# Patient Record
Sex: Female | Born: 1970 | Hispanic: Yes | Marital: Married | Smoking: Never smoker
Health system: Southern US, Community
[De-identification: ages and names within clinical notes are randomized; demographics above are authoritative.]

## PROBLEM LIST (undated history)

## (undated) DIAGNOSIS — K8020 Calculus of gallbladder without cholecystitis without obstruction: Secondary | ICD-10-CM

## (undated) DIAGNOSIS — J45909 Unspecified asthma, uncomplicated: Secondary | ICD-10-CM

## (undated) DIAGNOSIS — D219 Benign neoplasm of connective and other soft tissue, unspecified: Secondary | ICD-10-CM

## (undated) DIAGNOSIS — N920 Excessive and frequent menstruation with regular cycle: Secondary | ICD-10-CM

## (undated) DIAGNOSIS — D649 Anemia, unspecified: Secondary | ICD-10-CM

## (undated) DIAGNOSIS — T7840XA Allergy, unspecified, initial encounter: Secondary | ICD-10-CM

## (undated) DIAGNOSIS — N939 Abnormal uterine and vaginal bleeding, unspecified: Secondary | ICD-10-CM

## (undated) HISTORY — DX: Abnormal uterine and vaginal bleeding, unspecified: N93.9

## (undated) HISTORY — DX: Allergy, unspecified, initial encounter: T78.40XA

## (undated) HISTORY — DX: Excessive and frequent menstruation with regular cycle: N92.0

## (undated) HISTORY — DX: Anemia, unspecified: D64.9

## (undated) HISTORY — DX: Benign neoplasm of connective and other soft tissue, unspecified: D21.9

## (undated) HISTORY — DX: Calculus of gallbladder without cholecystitis without obstruction: K80.20

---

## 2007-07-10 ENCOUNTER — Encounter: Payer: Self-pay | Admitting: Family Medicine

## 2007-08-14 ENCOUNTER — Ambulatory Visit: Payer: Self-pay | Admitting: Family Medicine

## 2007-08-14 DIAGNOSIS — J309 Allergic rhinitis, unspecified: Secondary | ICD-10-CM | POA: Insufficient documentation

## 2007-08-14 DIAGNOSIS — J45909 Unspecified asthma, uncomplicated: Secondary | ICD-10-CM

## 2007-08-14 HISTORY — DX: Unspecified asthma, uncomplicated: J45.909

## 2007-11-01 ENCOUNTER — Encounter: Payer: Self-pay | Admitting: Family Medicine

## 2008-04-15 ENCOUNTER — Encounter: Payer: Self-pay | Admitting: Family Medicine

## 2011-06-07 ENCOUNTER — Ambulatory Visit (HOSPITAL_COMMUNITY): Admission: RE | Admit: 2011-06-07 | Payer: Self-pay | Source: Ambulatory Visit | Admitting: Obstetrics and Gynecology

## 2011-08-27 HISTORY — PX: DILATION AND CURETTAGE OF UTERUS: SHX78

## 2014-09-19 ENCOUNTER — Other Ambulatory Visit: Payer: Self-pay

## 2014-09-19 DIAGNOSIS — Z1231 Encounter for screening mammogram for malignant neoplasm of breast: Secondary | ICD-10-CM

## 2014-09-26 ENCOUNTER — Ambulatory Visit
Admission: RE | Admit: 2014-09-26 | Discharge: 2014-09-26 | Disposition: A | Discharge status: Home / Self Care | Payer: BC Managed Care – PPO | Source: Ambulatory Visit

## 2014-09-26 DIAGNOSIS — Z1231 Encounter for screening mammogram for malignant neoplasm of breast: Secondary | ICD-10-CM

## 2015-11-02 ENCOUNTER — Other Ambulatory Visit: Payer: Self-pay

## 2015-11-02 DIAGNOSIS — Z1231 Encounter for screening mammogram for malignant neoplasm of breast: Secondary | ICD-10-CM

## 2015-11-04 ENCOUNTER — Ambulatory Visit
Admission: RE | Admit: 2015-11-04 | Discharge: 2015-11-04 | Disposition: A | Discharge status: Home / Self Care | Payer: No Typology Code available for payment source | Source: Ambulatory Visit

## 2015-11-04 DIAGNOSIS — Z1231 Encounter for screening mammogram for malignant neoplasm of breast: Secondary | ICD-10-CM

## 2016-04-15 ENCOUNTER — Emergency Department
Admission: EM | Admit: 2016-04-15 | Discharge: 2016-04-15 | Disposition: A | Discharge status: Home / Self Care | Payer: PRIVATE HEALTH INSURANCE | Attending: Emergency Medicine | Admitting: Emergency Medicine

## 2016-04-15 DIAGNOSIS — D500 Iron deficiency anemia secondary to blood loss (chronic): Secondary | ICD-10-CM

## 2016-04-15 DIAGNOSIS — N939 Abnormal uterine and vaginal bleeding, unspecified: Secondary | ICD-10-CM | POA: Diagnosis not present

## 2016-04-15 DIAGNOSIS — J45909 Unspecified asthma, uncomplicated: Secondary | ICD-10-CM | POA: Diagnosis not present

## 2016-04-15 DIAGNOSIS — D594 Other nonautoimmune hemolytic anemias: Secondary | ICD-10-CM | POA: Insufficient documentation

## 2016-04-15 LAB — CBC WITH DIFFERENTIAL/PLATELET
BASOS ABS: 0.1 10*3/uL (ref 0–0.1)
BASOS PCT: 1 %
EOS ABS: 0.1 10*3/uL (ref 0–0.7)
EOS PCT: 1 %
HEMATOCRIT: 20.3 % — AB (ref 35.0–47.0)
Hemoglobin: 6.2 g/dL — ABNORMAL LOW (ref 12.0–16.0)
Lymphocytes Relative: 21 %
Lymphs Abs: 2 10*3/uL (ref 1.0–3.6)
MCH: 20.7 pg — ABNORMAL LOW (ref 26.0–34.0)
MCHC: 30.5 g/dL — AB (ref 32.0–36.0)
MCV: 68 fL — ABNORMAL LOW (ref 80.0–100.0)
MONO ABS: 0.7 10*3/uL (ref 0.2–0.9)
MONOS PCT: 7 %
NEUTROS ABS: 6.6 10*3/uL — AB (ref 1.4–6.5)
Neutrophils Relative %: 70 %
PLATELETS: 383 10*3/uL (ref 150–440)
RBC: 2.98 MIL/uL — ABNORMAL LOW (ref 3.80–5.20)
RDW: 16.6 % — AB (ref 11.5–14.5)
WBC: 9.6 10*3/uL (ref 3.6–11.0)

## 2016-04-15 LAB — URINALYSIS COMPLETE WITH MICROSCOPIC (ARMC ONLY)
Bacteria, UA: NONE SEEN
Bilirubin Urine: NEGATIVE
GLUCOSE, UA: NEGATIVE mg/dL
KETONES UR: NEGATIVE mg/dL
Leukocytes, UA: NEGATIVE
NITRITE: NEGATIVE
PROTEIN: NEGATIVE mg/dL
Specific Gravity, Urine: 1.013 (ref 1.005–1.030)
WBC UA: NONE SEEN WBC/hpf (ref 0–5)
pH: 7 (ref 5.0–8.0)

## 2016-04-15 LAB — COMPREHENSIVE METABOLIC PANEL
ALBUMIN: 3.9 g/dL (ref 3.5–5.0)
ALK PHOS: 65 U/L (ref 38–126)
ALT: 19 U/L (ref 14–54)
ANION GAP: 7 (ref 5–15)
AST: 20 U/L (ref 15–41)
BUN: 8 mg/dL (ref 6–20)
CO2: 26 mmol/L (ref 22–32)
Calcium: 9.2 mg/dL (ref 8.9–10.3)
Chloride: 104 mmol/L (ref 101–111)
Creatinine, Ser: 0.51 mg/dL (ref 0.44–1.00)
GFR calc Af Amer: >60 mL/min (ref >60)
GFR calc non Af Amer: >60 mL/min (ref >60)
GLUCOSE: 101 mg/dL — AB (ref 65–99)
POTASSIUM: 3.7 mmol/L (ref 3.5–5.1)
SODIUM: 137 mmol/L (ref 135–145)
Total Bilirubin: 0.5 mg/dL (ref 0.3–1.2)
Total Protein: 7.1 g/dL (ref 6.5–8.1)

## 2016-04-15 LAB — PROTIME-INR
INR: 1.02
Prothrombin Time: 13.6 seconds (ref 11.4–15.0)

## 2016-04-15 LAB — PREGNANCY, URINE: Preg Test, Ur: NEGATIVE

## 2016-04-15 LAB — APTT: aPTT: 25 seconds (ref 24–36)

## 2016-04-15 LAB — PHOSPHORUS: PHOSPHORUS: 3.5 mg/dL (ref 2.5–4.6)

## 2016-04-15 LAB — MAGNESIUM: Magnesium: 1.9 mg/dL (ref 1.7–2.4)

## 2016-04-15 MED ORDER — MEDROXYPROGESTERONE ACETATE 10 MG PO TABS: 20.0000 mg | ORAL_TABLET | Freq: Three times a day (TID) | ORAL | Status: DC

## 2016-04-15 MED ORDER — MEDROXYPROGESTERONE ACETATE 10 MG PO TABS
20.0000 mg | ORAL_TABLET | ORAL | Status: AC
  Administered 2016-04-15: 20 mg via ORAL
  Filled 2016-04-15: qty 2

## 2016-04-15 NOTE — ED Notes (Signed)
Pharmacy called regarding Provera, will send to ED.

## 2016-04-15 NOTE — Discharge Instructions (Signed)
Anemia, Nonspecific Anemia is a condition in which the concentration of red blood cells or hemoglobin in the blood is below normal. Hemoglobin is a substance in red blood cells that carries oxygen to the tissues of the body. Anemia results in not enough oxygen reaching these tissues.  CAUSES  Common causes of anemia include:   Excessive bleeding. Bleeding may be internal or external. This includes excessive bleeding from periods (in women) or from the intestine.   Poor nutrition.   Chronic kidney, thyroid, and liver disease.  Bone marrow disorders that decrease red blood cell production.  Cancer and treatments for cancer.  HIV, AIDS, and their treatments.  Spleen problems that increase red blood cell destruction.  Blood disorders.  Excess destruction of red blood cells due to infection, medicines, and autoimmune disorders. SIGNS AND SYMPTOMS   Minor weakness.   Dizziness.   Headache.  Palpitations.   Shortness of breath, especially with exercise.   Paleness.  Cold sensitivity.  Indigestion.  Nausea.  Difficulty sleeping.  Difficulty concentrating. Symptoms may occur suddenly or they may develop slowly.  DIAGNOSIS  Additional blood tests are often needed. These help your health care provider determine the best treatment. Your health care provider will check your stool for blood and look for other causes of blood loss.  TREATMENT  Treatment varies depending on the cause of the anemia. Treatment can include:   Supplements of iron, vitamin L79, or folic acid.   Hormone medicines.   A blood transfusion. This may be needed if blood loss is severe.   Hospitalization. This may be needed if there is significant continual blood loss.   Dietary changes.  Spleen removal. HOME CARE INSTRUCTIONS Keep all follow-up appointments. It often takes many weeks to correct anemia, and having your health care provider check on your condition and your response to  treatment is very important. SEEK IMMEDIATE MEDICAL CARE IF:   You develop extreme weakness, shortness of breath, or chest pain.   You become dizzy or have trouble concentrating.  You develop heavy vaginal bleeding.   You develop a rash.   You have bloody or black, tarry stools.   You faint.   You vomit up blood.   You vomit repeatedly.   You have abdominal pain.  You have a fever or persistent symptoms for more than 2-3 days.   You have a fever and your symptoms suddenly get worse.   You are dehydrated.  MAKE SURE YOU:  Understand these instructions.  Will watch your condition.  Will get help right away if you are not doing well or get worse.   This information is not intended to replace advice given to you by your health care provider. Make sure you discuss any questions you have with your health care provider.   Document Released: 01/19/2005 Document Revised: 08/14/2013 Document Reviewed: 06/07/2013 Elsevier Interactive Patient Education 2016 Elsevier Inc.  Abnormal Uterine Bleeding Abnormal uterine bleeding can affect women at various stages in life, including teenagers, women in their reproductive years, pregnant women, and women who have reached menopause. Several kinds of uterine bleeding are considered abnormal, including:  Bleeding or spotting between periods.   Bleeding after sexual intercourse.   Bleeding that is heavier or more than normal.   Periods that last longer than usual.  Bleeding after menopause.  Many cases of abnormal uterine bleeding are minor and simple to treat, while others are more serious. Any type of abnormal bleeding should be evaluated by your health care provider.  Treatment will depend on the cause of the bleeding. HOME CARE INSTRUCTIONS Monitor your condition for any changes. The following actions may help to alleviate any discomfort you are experiencing:  Avoid the use of tampons and douches as directed by your  health care provider.  Change your pads frequently. You should get regular pelvic exams and Pap tests. Keep all follow-up appointments for diagnostic tests as directed by your health care provider.  SEEK MEDICAL CARE IF:   Your bleeding lasts more than 1 week.   You feel dizzy at times.  SEEK IMMEDIATE MEDICAL CARE IF:   You pass out.   You are changing pads every 15 to 30 minutes.   You have abdominal pain.  You have a fever.   You become sweaty or weak.   You are passing large blood clots from the vagina.   You start to feel nauseous and vomit. MAKE SURE YOU:   Understand these instructions.  Will watch your condition.  Will get help right away if you are not doing well or get worse.   This information is not intended to replace advice given to you by your health care provider. Make sure you discuss any questions you have with your health care provider.   Document Released: 12/12/2005 Document Revised: 12/17/2013 Document Reviewed: 07/11/2013 Elsevier Interactive Patient Education [employer].

## 2016-04-15 NOTE — ED Notes (Signed)
Intermittent palpitations since Wednesday, denies CP or SOB. Fatigue, heavy period X 11 days. Pt alert and oriented X4, active, cooperative, pt in NAD. RR even and unlabored, color WNL.

## 2016-04-15 NOTE — ED Notes (Addendum)
Pt reports heart palpations, heavy period and generalized body aches

## 2016-04-15 NOTE — ED Provider Notes (Signed)
[facility] Emergency Department Provider Note  ____________________________________________  Time seen: 4:00 PM  I have reviewed the triage vital signs and the nursing notes.   HISTORY  Chief Complaint Vaginal Bleeding; Generalized Body Aches; and Palpitations    HPI Jeanette Townsend is a 45 y.o. female who complains of heavy vaginal bleeding related to her menses for the past 11 days. She is going through about 6 pads per day. With this she notes that she has been getting very fatigued, particularly when going up stairs. She also started to have some intermittent chest palpitations. She feels worse whenever she leans over. Denies dizziness or syncope. She reports the last month her period was longer than usual and heavier than usual as well. She has a history of uterine fibroid requiring myomectomy.  The patient is Jehovah's Witness, and does not consent to blood product transfusion. A copy of her health care proxy and advanced directive regarding this is placed on her chart. It does state that she may be willing to consider some limited transfusion if absolutely necessary.  She denies any change in her bowel habits. No dark black or bloody stools.  Her usual gynecologist is Dr. Toy Baker in [employer]   No past medical history on file. Uterine fibroid  Patient Active Problem List   Diagnosis Date Noted  . ALLERGIC RHINITIS 08/14/2007  . ASTHMA 08/14/2007     No past surgical history on file. Uterine myomectomy  No current outpatient prescriptions on file.   Allergies Review of patient's allergies indicates no known allergies.   No family history on file.  Social History Social History  Substance Use Topics  . Smoking status: Not on file  . Smokeless tobacco: Not on file  . Alcohol Use: Not on file   no tobacco alcohol or drug use  Review of Systems  Constitutional:   No fever or chills. Positive for fatigue  Eyes:   No vision changes.  ENT:    No sore throat. No rhinorrhea. Cardiovascular:   No chest pain. Positive for palpitations Respiratory:   No dyspnea or cough. Gastrointestinal:   Negative for abdominal pain, vomiting and diarrhea.  No bloody stool. Genitourinary:   Negative for dysuria or difficulty urinating. Positive vaginal bleeding Musculoskeletal:   Negative for focal pain or swelling Neurological:   Negative for headaches 10-point ROS otherwise negative.  ____________________________________________   PHYSICAL EXAM:  VITAL SIGNS: ED Triage Vitals  Enc Vitals Group     BP 04/15/16 1506 124/74 mmHg     Pulse Rate 04/15/16 1506 89     Resp 04/15/16 1506 18     Temp 04/15/16 1506 98.9 F (37.2 C)     Temp Source 04/15/16 1506 Oral     SpO2 04/15/16 1506 100 %     Weight 04/15/16 1506 167 lb (75.751 kg)     Height 04/15/16 1506 5\' 4"  (1.626 m)     Head Cir --      Peak Flow --      Pain Score --      Pain Loc --      Pain Edu? --      Excl. in [place]? --     Vital signs reviewed, nursing assessments reviewed.   Constitutional:   Alert and oriented. Well appearing and in no distress. Eyes:   No scleral icterus. Positive conjunctival pallor. PERRL. EOMI ENT   Head:   Normocephalic and atraumatic.   Nose:   No congestion/rhinnorhea. No septal hematoma  Mouth/Throat:   MMM, no pharyngeal erythema. No peritonsillar mass.    Neck:   No stridor. No SubQ emphysema. No meningismus. Hematological/Lymphatic/Immunilogical:   No cervical lymphadenopathy. Cardiovascular:   RRR. Symmetric bilateral radial and DP pulses.  No murmurs.  Respiratory:   Normal respiratory effort without tachypnea nor retractions. Breath sounds are clear and equal bilaterally. No wheezes/rales/rhonchi. Gastrointestinal:   Soft and nontender. Non distended. There is no CVA tenderness.  No rebound, rigidity, or guarding. Genitourinary:   External exam normal speculum exam reveals dark red blood with some clots in the vaginal  vault. There is some mild active bleeding through the cervical os. No lesions. Bimanual exam unremarkable and without CMT, adnexal tenderness or masses. Palpation of the uterus on bimanual exam shows it to be normal size and nontender. Musculoskeletal:   Nontender with normal range of motion in all extremities. No joint effusions.  No lower extremity tenderness.  No edema. Neurologic:   Normal speech and language.  CN 2-10 normal. Motor grossly intact. No gross focal neurologic deficits are appreciated.  Skin:    Skin is warm, dry and intact. No rash noted.  No petechiae, purpura, or bullae.  ____________________________________________    LABS (pertinent positives/negatives) (all labs ordered are listed, but only abnormal results are displayed) Labs Reviewed  CBC WITH DIFFERENTIAL/PLATELET - Abnormal; Notable for the following:    RBC 2.98 (*)    Hemoglobin 6.2 (*)    HCT 20.3 (*)    MCV 68.0 (*)    MCH 20.7 (*)    MCHC 30.5 (*)    RDW 16.6 (*)    Neutro Abs 6.6 (*)    All other components within normal limits  URINALYSIS COMPLETEWITH MICROSCOPIC ([facility] ONLY) - Abnormal; Notable for the following:    Color, Urine YELLOW (*)    APPearance CLEAR (*)    Hgb urine dipstick 3+ (*)    Squamous Epithelial / LPF 0-5 (*)    All other components within normal limits  COMPREHENSIVE METABOLIC PANEL - Abnormal; Notable for the following:    Glucose, Bld 101 (*)    All other components within normal limits  PREGNANCY, URINE  PHOSPHORUS  MAGNESIUM  APTT  PROTIME-INR   ____________________________________________   EKG  Interpreted by me Normal sinus rhythm rate of 88, normal axis. Slightly short PR interval at 110 ms. Other intervals unremarkable. Normal QRS and ST segments. T wave inversions in leads 3 and aVF.  ____________________________________________     RADIOLOGY    ____________________________________________   PROCEDURES   ____________________________________________   INITIAL IMPRESSION / ASSESSMENT AND PLAN / ED COURSE  Pertinent labs & imaging results that were available during my care of the patient were reviewed by me and considered in my medical decision making (see chart for details).  Patient presents with fatigue and heavy vaginal bleeding, found to have significant anemia from excess blood loss. She does still have some active bleeding on pelvic exam although it has perhaps slowed on its own. She does appear to have symptomatic anemia at this point, but due to her religious beliefs this unable to accept blood transfusion. I have paged gynecology for consult.  EKG shows some inferior T-wave inversions. This may be related to demand but as she is a young and healthy patient I would not expect that she has a fixed occlusion in her coronary arteries that would make her vulnerable. She has no chest pain or shortness of breath and low suspicion for ACS PE dissection or  carditis. No previous EKGs for comparison. No dysrhythmia noted to correlate with her report of palpitations.    ----------------------------------------- 5:56 PM on 04/15/2016 -----------------------------------------  I discussed the case with gynecology Dr. Star Age. He reports that because the bleeding appears to be mild based on my exam, vital signs unremarkable, as patient not significantly symptomatic, she should be suitable for treatment with high-dose Provera consistent with ACOG guidelines. We'll give her 20 mg 3 times a day for a week and then have her take 20 mg once daily after that. She'll follow up with either her gynecologist or Dr. Star Age on Monday which is in 3 days for further evaluation, return to the ER if her bleeding or symptoms worsen. Patient ambulated throughout the emergency department without any symptoms are worsening vaginal  bleeding. Her heart rate did increase to 1:30 during that time but she did not have any dizziness shortness of breath. She will increase her fluid intake to maintain adequate hydration.  ____________________________________________   FINAL CLINICAL IMPRESSION(S) / ED DIAGNOSES  Final diagnoses:  Vaginal bleeding  Symptomatic anemia       Portions of this note were generated with dragon dictation software. Dictation errors may occur despite best attempts at proofreading.   Carrie Mew, MD 04/15/16 [id number]

## 2016-04-15 NOTE — ED Notes (Signed)
Pt alert and oriented X4, active, cooperative, pt in NAD. RR even and unlabored, color WNL.  Pt alert and oriented X4, active, cooperative, pt in NAD. RR even and unlabored, color WNL.  PT able to ambulate with ease, agrees to increase PO intake.

## 2016-04-21 ENCOUNTER — Inpatient Hospital Stay: Payer: PRIVATE HEALTH INSURANCE | Attending: Internal Medicine | Admitting: Internal Medicine

## 2016-04-21 ENCOUNTER — Encounter: Payer: Self-pay | Admitting: Internal Medicine

## 2016-04-21 ENCOUNTER — Inpatient Hospital Stay: Payer: PRIVATE HEALTH INSURANCE

## 2016-04-21 VITALS — BP 119/78 | HR 98 | Temp 97.7°F | Wt 172.0 lb

## 2016-04-21 DIAGNOSIS — D509 Iron deficiency anemia, unspecified: Secondary | ICD-10-CM | POA: Diagnosis not present

## 2016-04-21 DIAGNOSIS — R002 Palpitations: Secondary | ICD-10-CM | POA: Diagnosis not present

## 2016-04-21 DIAGNOSIS — Z79899 Other long term (current) drug therapy: Secondary | ICD-10-CM | POA: Insufficient documentation

## 2016-04-21 DIAGNOSIS — N920 Excessive and frequent menstruation with regular cycle: Secondary | ICD-10-CM | POA: Insufficient documentation

## 2016-04-21 DIAGNOSIS — D500 Iron deficiency anemia secondary to blood loss (chronic): Secondary | ICD-10-CM

## 2016-04-21 DIAGNOSIS — R0602 Shortness of breath: Secondary | ICD-10-CM | POA: Diagnosis not present

## 2016-04-21 DIAGNOSIS — D219 Benign neoplasm of connective and other soft tissue, unspecified: Secondary | ICD-10-CM | POA: Insufficient documentation

## 2016-04-21 DIAGNOSIS — K5900 Constipation, unspecified: Secondary | ICD-10-CM | POA: Diagnosis not present

## 2016-04-21 LAB — CBC WITH DIFFERENTIAL/PLATELET
BASOS ABS: 0.1 10*3/uL (ref 0–0.1)
Basophils Relative: 1 %
EOS ABS: 0.2 10*3/uL (ref 0–0.7)
EOS PCT: 1 %
HEMATOCRIT: 21.5 % — AB (ref 35.0–47.0)
HEMOGLOBIN: 6.3 g/dL — AB (ref 12.0–16.0)
LYMPHS PCT: 18 %
Lymphs Abs: 2.4 10*3/uL (ref 1.0–3.6)
MCH: 20.4 pg — AB (ref 26.0–34.0)
MCHC: 29.4 g/dL — AB (ref 32.0–36.0)
MCV: 69.3 fL — ABNORMAL LOW (ref 80.0–100.0)
MONO ABS: 0.9 10*3/uL (ref 0.2–0.9)
Monocytes Relative: 7 %
Neutro Abs: 9.5 10*3/uL — ABNORMAL HIGH (ref 1.4–6.5)
Neutrophils Relative %: 73 %
Platelets: 456 10*3/uL — ABNORMAL HIGH (ref 150–440)
RBC: 3.09 MIL/uL — AB (ref 3.80–5.20)
RDW: 17.2 % — AB (ref 11.5–14.5)
WBC: 13.1 10*3/uL — AB (ref 3.6–11.0)

## 2016-04-21 LAB — FERRITIN: FERRITIN: 5 ng/mL — AB (ref 11–307)

## 2016-04-21 LAB — IRON AND TIBC
IRON: 12 ug/dL — AB (ref 28–170)
Saturation Ratios: 2 % — ABNORMAL LOW (ref 10.4–31.8)
TIBC: 555 ug/dL — ABNORMAL HIGH (ref 250–450)
UIBC: 543 ug/dL

## 2016-04-21 LAB — LACTATE DEHYDROGENASE: LDH: 139 U/L (ref 98–192)

## 2016-04-21 NOTE — Progress Notes (Signed)
[place] NOTE  Patient Care Team: Janyth Pupa, DO as PCP - General (Obstetrics and Gynecology); Fredderick Erb; Gyn  CHIEF COMPLAINTS/PURPOSE OF CONSULTATION:   # April 2017- IRON DEFICIENCY ANEMIA [Jehov[employer] Witness]  # MENORRHAGIA [[facility]]- on provera.   HISTORY OF PRESENTING ILLNESS:  Jeanette Townsend 45 y.o.  female with a history of heavy menstrual periods seem the emergency room in April for hemoglobin of 6.2 and has been referred to [place] by gynecology for further recommendations for iron deficiency anemia.   Patient complained of shortness of breath with exertion. She complains of significant fatigue. She complains of palpitations.   Patient is currently on Provera by mouth for menstrual cycle suppression. She is also being worked up by gynecology for her menorrhagia. Patient has been started on by mouth iron twice a day; however has constipation.  No blood in stools black colored stools. No nausea no vomiting. No weight loss. No blood in urine.  ROS: A complete 10 point review of system is done which is negative except mentioned above in history of present illness  MEDICAL HISTORY:  Past Medical History  Diagnosis Date  . Allergy   . Anemia   . Excessive vaginal bleeding     SURGICAL HISTORY: # fibroids removal 2012.   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  Social History   Social History  . Marital Status: Married    Spouse Name: N/A  . Number of Children: N/A  . Years of Education: N/A   Occupational History  . Not on file.   Social History Main Topics  . Smoking status: Never Smoker   . Smokeless tobacco: Never Used  . Alcohol Use: No  . Drug Use: No  . Sexual Activity: Yes   Other Topics Concern  . Not on file   Social History Narrative  . No narrative on file    FAMILY HISTORY: NO family history of any cancer. History reviewed. No pertinent family history.  ALLERGIES:  has No Known  Allergies.  MEDICATIONS:  Current Outpatient Prescriptions  Medication Sig Dispense Refill  . ferrous sulfate 325 (65 FE) MG tablet Take 325 mg by mouth 2 (two) times daily with a meal.    . medroxyPROGESTERone (PROVERA) 10 MG tablet Take 2 tablets (20 mg total) by mouth 3 (three) times daily. Take 2 tablets (20 mg total) by mouth three times a day for 7 days.  After that, take 2 tablets (20mg ) by mouth once a day until told to discontinue this medication by your doctor. 180 tablet 0   No current facility-administered medications for this visit.      Marland Kitchen  PHYSICAL EXAMINATION:   Filed Vitals:   04/21/16 0923  BP: 119/78  Pulse: 98  Temp: 97.7 F (36.5 C)   Filed Weights   04/21/16 0923  Weight: 171 lb 15.3 oz (78 kg)    GENERAL: Well-nourished well-developed; Alert, no distress and comfortable.  With her husband. EYES: positive for pallor.  OROPHARYNX: no thrush or ulceration; good dentition  NECK: supple, no masses felt LYMPH:  no palpable lymphadenopathy in the cervical, axillary or inguinal regions LUNGS: clear to auscultation and  No wheeze or crackles HEART/CVS: regular rate & rhythm and no murmurs; No lower extremity edema ABDOMEN: abdomen soft, non-tender and normal bowel sounds Musculoskeletal:no cyanosis of digits and no clubbing  PSYCH: alert & oriented x 3 with fluent speech NEURO: no focal motor/sensory deficits SKIN:  no rashes or significant  lesions  LABORATORY DATA:  I have reviewed the data as listed Lab Results  Component Value Date   WBC 9.6 04/15/2016   HGB 6.2* 04/15/2016   HCT 20.3* 04/15/2016   MCV 68.0* 04/15/2016   PLT 383 04/15/2016    Recent Labs  04/15/16 1549  NA 137  K 3.7  CL 104  CO2 26  GLUCOSE 101*  BUN 8  CREATININE 0.51  CALCIUM 9.2  GFRNONAA >60  GFRAA >60  PROT 7.1  ALBUMIN 3.9  AST 20  ALT 19  ALKPHOS 65  BILITOT 0.5     ASSESSMENT & PLAN:   # Iron deficiency anemia secondary to menorrhagia. Most recent  hemoglobin 6.2; MCV 68. Check CBC and LDH iron studies ferritin today. Plan IV iron iron weekly 2; discuss the possible infusion reactions.   # Patient follow-up in 3 months with me with CBC CMP and iron studies ferritin; we will get a follow-up CBC in 6 weeks.   Thank you Dr. Georgianne Fick for allowing me to participate in the care of your pleasant patient. Please do not hesitate to contact me with questions or concerns in the interim.  # 30 minutes face-to-face with the patient discussing the above plan of care; more than 50% of time spent on counseling and coordination.     Cammie Sickle, MD 04/21/2016 9:34 AM

## 2016-04-21 NOTE — Patient Instructions (Signed)
Iron Deficiency Anemia, Adult Anemia is a condition in which there are less red blood cells or hemoglobin in the blood than normal. Hemoglobin is the part of red blood cells that carries oxygen. Iron deficiency anemia is anemia caused by too little iron. It is the most common type of anemia. It may leave you tired and short of breath. CAUSES   Lack of iron in the diet.  Poor absorption of iron, as seen with intestinal disorders.  Intestinal bleeding.  Heavy periods. SIGNS AND SYMPTOMS  Mild anemia may not be noticeable. Symptoms may include:  Fatigue.  Headache.  Pale skin.  Weakness.  Tiredness.  Shortness of breath.  Dizziness.  Cold hands and feet.  Fast or irregular heartbeat. DIAGNOSIS  Diagnosis requires a thorough evaluation and physical exam by your health care provider. Blood tests are generally used to confirm iron deficiency anemia. Additional tests may be done to find the underlying cause of your anemia. These may include:  Testing for blood in the stool (fecal occult blood test).  A procedure to see inside the colon and rectum (colonoscopy).  A procedure to see inside the esophagus and stomach (endoscopy). TREATMENT  Iron deficiency anemia is treated by correcting the cause of the deficiency. Treatment may involve:  Adding iron-rich foods to your diet.  Taking iron supplements. Pregnant or breastfeeding women need to take extra iron because their normal diet usually does not provide the required amount.  Taking vitamins. Vitamin C improves the absorption of iron. Your health care provider may recommend that you take your iron tablets with a glass of orange juice or vitamin C supplement.  Medicines to make heavy menstrual flow lighter.  Surgery. HOME CARE INSTRUCTIONS   Take iron as directed by your health care provider.  If you cannot tolerate taking iron supplements by mouth, talk to your health care provider about taking them through a vein  (intravenously) or an injection into a muscle.  For the best iron absorption, iron supplements should be taken on an empty stomach. If you cannot tolerate them on an empty stomach, you may need to take them with food.  Do not drink milk or take antacids at the same time as your iron supplements. Milk and antacids may interfere with the absorption of iron.  Iron supplements can cause constipation. Make sure to include fiber in your diet to prevent constipation. A stool softener may also be recommended.  Take vitamins as directed by your health care provider.  Eat a diet rich in iron. Foods high in iron include liver, lean beef, whole-grain bread, eggs, dried fruit, and dark green leafy vegetables. SEEK IMMEDIATE MEDICAL CARE IF:   You faint. If this happens, do not drive. Call your local emergency services (911 in [place] if no other help is available.  You have chest pain.  You feel nauseous or vomit.  You have severe or increased shortness of breath with activity.  You feel weak.  You have a rapid heartbeat.  You have unexplained sweating.  You become light-headed when getting up from a chair or bed. MAKE SURE YOU:   Understand these instructions.  Will watch your condition.  Will get help right away if you are not doing well or get worse.   This information is not intended to replace advice given to you by your health care provider. Make sure you discuss any questions you have with your health care provider.   Document Released: 12/09/2000 Document Revised: 01/02/2015 Document Reviewed: 08/19/2013 Elsevier   Interactive Patient Education 2016 Elsevier Inc.  

## 2016-04-28 ENCOUNTER — Inpatient Hospital Stay: Payer: No Typology Code available for payment source | Attending: Internal Medicine

## 2016-04-28 ENCOUNTER — Other Ambulatory Visit: Payer: Self-pay | Admitting: Internal Medicine

## 2016-04-28 VITALS — BP 103/68 | HR 94 | Resp 18

## 2016-04-28 DIAGNOSIS — D500 Iron deficiency anemia secondary to blood loss (chronic): Secondary | ICD-10-CM | POA: Insufficient documentation

## 2016-04-28 DIAGNOSIS — Z79899 Other long term (current) drug therapy: Secondary | ICD-10-CM | POA: Insufficient documentation

## 2016-04-28 MED ORDER — SODIUM CHLORIDE 0.9 % IV SOLN
510.0000 mg | Freq: Once | INTRAVENOUS | Status: AC
  Administered 2016-04-28: 510 mg via INTRAVENOUS
  Filled 2016-04-28: qty 17

## 2016-04-28 MED ORDER — SODIUM CHLORIDE 0.9 % IV SOLN
Freq: Once | INTRAVENOUS | Status: AC
  Administered 2016-04-28: 14:00:00 via INTRAVENOUS
  Filled 2016-04-28: qty 1000

## 2016-05-05 ENCOUNTER — Inpatient Hospital Stay: Payer: No Typology Code available for payment source

## 2016-05-05 VITALS — BP 121/78 | HR 71 | Temp 97.0°F | Resp 18

## 2016-05-05 DIAGNOSIS — D500 Iron deficiency anemia secondary to blood loss (chronic): Secondary | ICD-10-CM | POA: Diagnosis not present

## 2016-05-05 MED ORDER — FERUMOXYTOL INJECTION 510 MG/17 ML
510.0000 mg | Freq: Once | INTRAVENOUS | Status: AC
  Administered 2016-05-05: 510 mg via INTRAVENOUS
  Filled 2016-05-05: qty 17

## 2016-05-05 MED ORDER — SODIUM CHLORIDE 0.9 % IV SOLN
Freq: Once | INTRAVENOUS | Status: AC
  Administered 2016-05-05: 14:00:00 via INTRAVENOUS
  Filled 2016-05-05: qty 1000

## 2016-05-19 ENCOUNTER — Encounter
Admission: RE | Admit: 2016-05-19 | Discharge: 2016-05-19 | Disposition: A | Discharge status: Home / Self Care | Payer: PRIVATE HEALTH INSURANCE | Source: Ambulatory Visit | Attending: Obstetrics and Gynecology | Admitting: Obstetrics and Gynecology

## 2016-05-19 DIAGNOSIS — Z01812 Encounter for preprocedural laboratory examination: Secondary | ICD-10-CM | POA: Insufficient documentation

## 2016-05-19 HISTORY — DX: Unspecified asthma, uncomplicated: J45.909

## 2016-05-19 LAB — CBC
HCT: 36.1 % (ref 35.0–47.0)
Hemoglobin: 11.8 g/dL — ABNORMAL LOW (ref 12.0–16.0)
MCH: 26.7 pg (ref 26.0–34.0)
MCHC: 32.7 g/dL (ref 32.0–36.0)
MCV: 81.7 fL (ref 80.0–100.0)
PLATELETS: 250 10*3/uL (ref 150–440)
RBC: 4.42 MIL/uL (ref 3.80–5.20)
RDW: 29.5 % — AB (ref 11.5–14.5)
WBC: 8.1 10*3/uL (ref 3.6–11.0)

## 2016-05-19 NOTE — Pre-Procedure Instructions (Signed)
ANESTHESIA - MEDICAL CLEARANCE ON CHART

## 2016-05-19 NOTE — Patient Instructions (Signed)
Your procedure is scheduled on: Thursday 05/26/16 Report to Day Surgery. 2ND FLOOR MEDICAL MALL ENTRANCE To find out your arrival time please call [phone] between 1PM - 3PM on Wednesday 05/25/16.  Remember: Instructions that are not followed completely may result in serious medical risk, up to and including death, or upon the discretion of your surgeon and anesthesiologist your surgery may need to be rescheduled.    __X__ 1. Do not eat food or drink liquids after midnight. No gum chewing or hard candies.     __X__ 2. No Alcohol for 24 hours before or after surgery.   ____ 3. Bring all medications with you on the day of surgery if instructed.    __X__ 4. Notify your doctor if there is any change in your medical condition     (cold, fever, infections).     Do not wear jewelry, make-up, hairpins, clips or nail polish.  Do not wear lotions, powders, or perfumes.   Do not shave 48 hours prior to surgery. Men may shave face and neck.  Do not bring valuables to the hospital.    [facility] is not responsible for any belongings or valuables.               Contacts, dentures or bridgework may not be worn into surgery.  Leave your suitcase in the car. After surgery it may be brought to your room.  For patients admitted to the hospital, discharge time is determined by your                treatment team.   Patients discharged the day of surgery will not be allowed to drive home.   Please read over the following fact sheets that you were given:   Surgical Site Infection Prevention   ____ Take these medicines the morning of surgery with A SIP OF WATER:    1. NONE  2.   3.   4.  5.  6.  ____ Fleet Enema (as directed)   ____ Use CHG Soap as directed  ____ Use inhalers on the day of surgery  ____ Stop metformin 2 days prior to surgery    ____ Take 1/2 of usual insulin dose the night before surgery and none on the morning of surgery.   ____ Stop Coumadin/Plavix/aspirin on NO ADVIL  IBUPROFEN MOTRIN OR ALEVE MAY TAKE TYLENOL FOR PAIN UNTIL AFTER SURGERY  ____ Stop Anti-inflammatories on    ____ Stop supplements until after surgery.    ____ Bring C-Pap to the hospital.

## 2016-05-26 ENCOUNTER — Ambulatory Visit: Payer: No Typology Code available for payment source | Admitting: Anesthesiology

## 2016-05-26 ENCOUNTER — Ambulatory Visit
Admission: RE | Admit: 2016-05-26 | Discharge: 2016-05-26 | Disposition: A | Discharge status: Home / Self Care | Payer: No Typology Code available for payment source | Source: Ambulatory Visit | Attending: Obstetrics and Gynecology | Admitting: Obstetrics and Gynecology

## 2016-05-26 ENCOUNTER — Encounter: Payer: Self-pay | Admitting: *Deleted

## 2016-05-26 ENCOUNTER — Encounter
Admission: RE | Disposition: A | Discharge status: Home / Self Care | Payer: Self-pay | Source: Ambulatory Visit | Attending: Obstetrics and Gynecology

## 2016-05-26 DIAGNOSIS — Z8379 Family history of other diseases of the digestive system: Secondary | ICD-10-CM | POA: Insufficient documentation

## 2016-05-26 DIAGNOSIS — D649 Anemia, unspecified: Secondary | ICD-10-CM | POA: Diagnosis not present

## 2016-05-26 DIAGNOSIS — N920 Excessive and frequent menstruation with regular cycle: Secondary | ICD-10-CM | POA: Insufficient documentation

## 2016-05-26 DIAGNOSIS — D250 Submucous leiomyoma of uterus: Secondary | ICD-10-CM | POA: Diagnosis not present

## 2016-05-26 DIAGNOSIS — Z9889 Other specified postprocedural states: Secondary | ICD-10-CM

## 2016-05-26 DIAGNOSIS — N800 Endometriosis of uterus: Secondary | ICD-10-CM | POA: Diagnosis not present

## 2016-05-26 DIAGNOSIS — Z79899 Other long term (current) drug therapy: Secondary | ICD-10-CM | POA: Insufficient documentation

## 2016-05-26 HISTORY — PX: DILATATION & CURETTAGE/HYSTEROSCOPY WITH MYOSURE: SHX6511

## 2016-05-26 LAB — POCT PREGNANCY, URINE: Preg Test, Ur: NEGATIVE

## 2016-05-26 SURGERY — DILATATION & CURETTAGE/HYSTEROSCOPY WITH MYOSURE
Anesthesia: General | Wound class: Clean Contaminated

## 2016-05-26 MED ORDER — ONDANSETRON HCL 4 MG/2ML IJ SOLN
4.0000 mg | Freq: Once | INTRAMUSCULAR | Status: DC | PRN
Start: 2016-05-26 — End: 2016-05-26

## 2016-05-26 MED ORDER — IBUPROFEN 600 MG PO TABS
600.0000 mg | ORAL_TABLET | Freq: Four times a day (QID) | ORAL | Status: DC | PRN
Start: 2016-05-26 — End: 2016-09-20

## 2016-05-26 MED ORDER — VASOPRESSIN 20 UNIT/ML IV SOLN
INTRAVENOUS | Status: AC
  Filled 2016-05-26: qty 1

## 2016-05-26 MED ORDER — SODIUM CHLORIDE 0.9 % IJ SOLN
INTRAMUSCULAR | Status: AC
  Filled 2016-05-26: qty 100

## 2016-05-26 MED ORDER — PROPOFOL 10 MG/ML IV BOLUS
INTRAVENOUS | Status: DC | PRN
  Administered 2016-05-26: 140 mg via INTRAVENOUS

## 2016-05-26 MED ORDER — MIDAZOLAM HCL 2 MG/2ML IJ SOLN
INTRAMUSCULAR | Status: DC | PRN
  Administered 2016-05-26: 2 mg via INTRAVENOUS

## 2016-05-26 MED ORDER — ACETAMINOPHEN 10 MG/ML IV SOLN
INTRAVENOUS | Status: AC
  Filled 2016-05-26: qty 100

## 2016-05-26 MED ORDER — VASOPRESSIN 20 UNIT/ML IV SOLN
INTRAVENOUS | Status: DC | PRN
  Administered 2016-05-26: 10 [IU] via SUBCUTANEOUS

## 2016-05-26 MED ORDER — HYDROCODONE-ACETAMINOPHEN 5-325 MG PO TABS: 1.0000 | ORAL_TABLET | Freq: Four times a day (QID) | ORAL | Status: DC | PRN

## 2016-05-26 MED ORDER — FENTANYL CITRATE (PF) 100 MCG/2ML IJ SOLN
INTRAMUSCULAR | Status: DC | PRN
  Administered 2016-05-26 (×2): 50 ug via INTRAVENOUS

## 2016-05-26 MED ORDER — FAMOTIDINE 20 MG PO TABS
20.0000 mg | ORAL_TABLET | Freq: Once | ORAL | Status: AC
  Administered 2016-05-26: 20 mg via ORAL

## 2016-05-26 MED ORDER — FENTANYL CITRATE (PF) 100 MCG/2ML IJ SOLN: 25.0000 ug | INTRAMUSCULAR | Status: DC | PRN

## 2016-05-26 MED ORDER — LACTATED RINGERS IV SOLN
INTRAVENOUS | Status: DC | PRN
  Administered 2016-05-26: 10:00:00 via INTRAVENOUS

## 2016-05-26 MED ORDER — LACTATED RINGERS IV SOLN
INTRAVENOUS | Status: DC
  Administered 2016-05-26: 09:00:00 via INTRAVENOUS

## 2016-05-26 MED ORDER — FAMOTIDINE 20 MG PO TABS
ORAL_TABLET | ORAL | Status: AC
Start: 2016-05-26 — End: 2016-05-26
  Administered 2016-05-26: 20 mg via ORAL
  Filled 2016-05-26: qty 1

## 2016-05-26 MED ORDER — SODIUM CHLORIDE 0.9 % IR SOLN
Status: DC | PRN
  Administered 2016-05-26: 4200 mL

## 2016-05-26 SURGICAL SUPPLY — 21 items
ABLATOR ENDOMETRIAL MYOSURE (ABLATOR) ×3 IMPLANT
CANISTER SUC SOCK COL 7IN (MISCELLANEOUS) ×3 IMPLANT
CATH ROBINSON RED A/P 16FR (CATHETERS) ×3 IMPLANT
ELECT REM PT RETURN 9FT ADLT (ELECTROSURGICAL) ×3
ELECTRODE REM PT RTRN 9FT ADLT (ELECTROSURGICAL) ×1 IMPLANT
GLOVE BIO SURGEON STRL SZ7 (GLOVE) ×3 IMPLANT
GOWN STRL REUS W/ TWL LRG LVL3 (GOWN DISPOSABLE) ×2 IMPLANT
GOWN STRL REUS W/TWL LRG LVL3 (GOWN DISPOSABLE) ×4
KIT RM TURNOVER CYSTO AR (KITS) ×3 IMPLANT
MYOSURE LITE POLYP REMOVAL (MISCELLANEOUS) IMPLANT
NDL ENDOSCOPIC URO 20G (NEEDLE) ×3 IMPLANT
PACK DNC HYST (MISCELLANEOUS) ×3 IMPLANT
PAD OB MATERNITY 4.3X12.25 (PERSONAL CARE ITEMS) ×3 IMPLANT
PAD PREP 24X41 OB/GYN DISP (PERSONAL CARE ITEMS) ×3 IMPLANT
SEAL ROD LENS SCOPE MYOSURE (ABLATOR) ×3 IMPLANT
SOL .9 NS 3000ML IRR  AL (IV SOLUTION) ×4
SOL .9 NS 3000ML IRR UROMATIC (IV SOLUTION) ×2 IMPLANT
TOWEL OR 17X26 4PK STRL BLUE (TOWEL DISPOSABLE) ×3 IMPLANT
TUBING CONNECTING 10 (TUBING) ×2 IMPLANT
TUBING CONNECTING 10' (TUBING) ×1
TUBING HYSTEROSCOPY DOLPHIN (MISCELLANEOUS) ×3 IMPLANT

## 2016-05-26 NOTE — Transfer of Care (Signed)
Immediate Anesthesia Transfer of Care Note  Patient: Jeanette Townsend  Procedure(s) Performed: Procedure(s): DILATATION & CURETTAGE/HYSTEROSCOPY WITH MYOSURE (N/A)  Patient Location: PACU  Anesthesia Type:General  Level of Consciousness: awake  Airway & Oxygen Therapy: Patient Spontanous Breathing and Patient connected to face mask oxygen  Post-op Assessment: Report given to RN  Post vital signs: Reviewed and stable  Last Vitals:  Filed Vitals:   05/26/16 0831 05/26/16 1055  BP: 122/70 107/63  Pulse: 82 61  Temp: 36.8 C 37 C  Resp: 16 19    Last Pain:  Filed Vitals:   05/26/16 1056  PainSc: 0-No pain         Complications: No apparent anesthesia complications

## 2016-05-26 NOTE — Discharge Instructions (Signed)

## 2016-05-26 NOTE — Op Note (Addendum)
Patient Name: Jeanette Townsend Date of Procedure: 05/26/2016  Preoperative Diagnosis: 1) 45 y.o. with menorrhagia to anemia 2) Submucosal leiomyoma  Postoperative Diagnosis: 1) 45 y.o. with menorrhagia to anemia 2) Submucosal leiomyoma  Operation Performed: Operative Hysteroscopy, resection of submucosal leiomyoma (myomectomy)  Indication: Submucosal leiomyoma and menorrhagia to anemia  Anesthesia: General  Primary Surgeon: Malachy Mood, MD  Assistant: none  Preoperative Antibiotics: none  Estimated Blood Loss: minimal  IV Fluids: 713mL  Hysteroscopy fluid deficit: 72mL of LR  Urine Output:: ~66mL straight cath  Drains or Tubes: none  Implants: none  Specimens Removed: Submucosal leiomyoma (morcelated)   Complications: none  Intraoperative Findings:  Approximately 2cm submucosal leiomyoma on a thin stalk.  Base of stalk originated midway into the uterine cavity anterior wall.  Normal tubal ostia, otherwise normal uterine cavity and cervix  Patient Condition: stable  Procedure in Detail:  Patient was taken to the operating room were she was administered general endotracheal anesthesia.  She was positioned in the dorsal lithotomy position utilizing Allen stirups, prepped and draped in the usual sterile fashion.  Uterus was noted to be non-enlarged in size, anteverted.   Prior to proceeding with the case a time out was performed. Attention was turned to the patient's pelvis.  A red rubber catheter was used to empty the patient's bladder.  An operative speculum was placed to allow visualization of the cervix.  The anterior lip of the cervix was grasped with a single tooth tenaculum and the cervix was sequentially dilated using pratt dilators.  The hysteroscope was then advanced into the uterine cavity noting the above findings.  A long deflux needle was used to inject the fibroid with 64mL of 0.2U/mL vasopressin/NS solution.  The Myosure device was introduced into the cavity  and the leiomyoma was shaves down with no visible remnants at the end of the resection.  The resection site was noted to be hemostatic. The single tooth tenaculum was removed from the cervix.  The tenaculum sites and cervix were noted to be  hemostatic before removing the operative speculum.  Sponge needle and instrument counts were corrects times two.  The patient tolerated the procedure well and was taken to the recovery room in stable condition.

## 2016-05-26 NOTE — H&P (Signed)
Date of Initial paper H&P: 05/19/2016  History reviewed, patient examined, no change in status, stable for surgery.

## 2016-05-26 NOTE — Anesthesia Procedure Notes (Signed)
Procedure Name: LMA Insertion Date/Time: 05/26/2016 10:07 AM Performed by: [id number], Arminta Gamm Pre-anesthesia Checklist: Patient identified, Emergency Drugs available, Suction available, Patient being monitored and Timeout performed Patient Re-evaluated:Patient Re-evaluated prior to inductionOxygen Delivery Method: Circle system utilized Preoxygenation: Pre-oxygenation with 100% oxygen Intubation Type: IV induction LMA Size: 3.5 Number of attempts: 1 Tube secured with: Tape

## 2016-05-26 NOTE — Anesthesia Preprocedure Evaluation (Signed)
Anesthesia Evaluation  Patient identified by MRN, date of birth, ID band Patient awake    Reviewed: Allergy & Precautions, NPO status , Patient's Chart, lab work & pertinent test results  History of Anesthesia Complications Negative for: history of anesthetic complications  Airway Mallampati: II       Dental   Pulmonary neg pulmonary ROS,           Cardiovascular negative cardio ROS       Neuro/Psych negative neurological ROS     GI/Hepatic negative GI ROS, Neg liver ROS,   Endo/Other  negative endocrine ROS  Renal/GU negative Renal ROS     Musculoskeletal   Abdominal   Peds  Hematology  (+) anemia ,   Anesthesia Other Findings   Reproductive/Obstetrics                             Anesthesia Physical Anesthesia Plan  ASA: II  Anesthesia Plan: General   Post-op Pain Management:    Induction: Intravenous  Airway Management Planned: LMA  Additional Equipment:   Intra-op Plan:   Post-operative Plan:   Informed Consent: I have reviewed the patients History and Physical, chart, labs and discussed the procedure including the risks, benefits and alternatives for the proposed anesthesia with the patient or authorized representative who has indicated his/her understanding and acceptance.     Plan Discussed with:   Anesthesia Plan Comments:         Anesthesia Quick Evaluation

## 2016-05-26 NOTE — Anesthesia Postprocedure Evaluation (Signed)
Anesthesia Post Note  Patient: Jeanette Townsend  Procedure(s) Performed: Procedure(s) (LRB): DILATATION & CURETTAGE/HYSTEROSCOPY WITH MYOSURE RESECTION OF SUBMUCOSAL FIBROID (N/A)  Patient location during evaluation: PACU Anesthesia Type: General Level of consciousness: awake and alert Pain management: pain level controlled Vital Signs Assessment: post-procedure vital signs reviewed and stable Respiratory status: spontaneous breathing and respiratory function stable Cardiovascular status: stable Anesthetic complications: no    Last Vitals:  Filed Vitals:   05/26/16 1150 05/26/16 1245  BP: 118/72 113/72  Pulse: 64   Temp: 36.1 C   Resp: 18     Last Pain:  Filed Vitals:   05/26/16 1251  PainSc: 0-No pain                 KEPHART,WILLIAM K

## 2016-05-27 LAB — SURGICAL PATHOLOGY

## 2016-06-02 ENCOUNTER — Other Ambulatory Visit: Payer: No Typology Code available for payment source

## 2016-07-20 ENCOUNTER — Encounter (INDEPENDENT_AMBULATORY_CARE_PROVIDER_SITE_OTHER): Payer: Self-pay

## 2016-07-20 ENCOUNTER — Inpatient Hospital Stay: Payer: No Typology Code available for payment source | Attending: Internal Medicine

## 2016-07-20 DIAGNOSIS — D500 Iron deficiency anemia secondary to blood loss (chronic): Secondary | ICD-10-CM

## 2016-07-20 LAB — CBC WITH DIFFERENTIAL/PLATELET
BASOS ABS: 0.1 10*3/uL (ref 0–0.1)
BASOS PCT: 1 %
EOS PCT: 3 %
Eosinophils Absolute: 0.2 10*3/uL (ref 0–0.7)
HCT: 38.8 % (ref 35.0–47.0)
Hemoglobin: 13.1 g/dL (ref 12.0–16.0)
LYMPHS PCT: 24 %
Lymphs Abs: 1.4 10*3/uL (ref 1.0–3.6)
MCH: 29.5 pg (ref 26.0–34.0)
MCHC: 33.7 g/dL (ref 32.0–36.0)
MCV: 87.5 fL (ref 80.0–100.0)
MONO ABS: 0.5 10*3/uL (ref 0.2–0.9)
Monocytes Relative: 8 %
Neutro Abs: 3.9 10*3/uL (ref 1.4–6.5)
Neutrophils Relative %: 64 %
PLATELETS: 222 10*3/uL (ref 150–440)
RBC: 4.43 MIL/uL (ref 3.80–5.20)
RDW: 14.1 % (ref 11.5–14.5)
WBC: 6 10*3/uL (ref 3.6–11.0)

## 2016-07-20 LAB — IRON AND TIBC
Iron: 71 ug/dL (ref 28–170)
Saturation Ratios: 17 % (ref 10.4–31.8)
TIBC: 430 ug/dL (ref 250–450)
UIBC: 359 ug/dL

## 2016-07-21 ENCOUNTER — Ambulatory Visit: Payer: No Typology Code available for payment source

## 2016-07-21 ENCOUNTER — Other Ambulatory Visit: Payer: Self-pay | Admitting: *Deleted

## 2016-07-21 ENCOUNTER — Ambulatory Visit: Payer: No Typology Code available for payment source | Admitting: Internal Medicine

## 2016-07-21 DIAGNOSIS — D509 Iron deficiency anemia, unspecified: Secondary | ICD-10-CM

## 2016-07-21 NOTE — Progress Notes (Signed)
Called patient and left message that hgb is improving.  No IV venofer needed at this time.  Will recheck CBC, iron studies and ferritin in 4 months.  See MD one week later.

## 2016-09-20 ENCOUNTER — Encounter: Payer: Self-pay | Admitting: Internal Medicine

## 2016-09-20 ENCOUNTER — Inpatient Hospital Stay: Payer: No Typology Code available for payment source | Attending: Internal Medicine | Admitting: Internal Medicine

## 2016-09-20 DIAGNOSIS — Z90710 Acquired absence of both cervix and uterus: Secondary | ICD-10-CM | POA: Diagnosis not present

## 2016-09-20 DIAGNOSIS — D72829 Elevated white blood cell count, unspecified: Secondary | ICD-10-CM | POA: Diagnosis not present

## 2016-09-20 DIAGNOSIS — D500 Iron deficiency anemia secondary to blood loss (chronic): Secondary | ICD-10-CM | POA: Insufficient documentation

## 2016-09-20 DIAGNOSIS — J45909 Unspecified asthma, uncomplicated: Secondary | ICD-10-CM | POA: Diagnosis not present

## 2016-09-20 DIAGNOSIS — D219 Benign neoplasm of connective and other soft tissue, unspecified: Secondary | ICD-10-CM | POA: Diagnosis not present

## 2016-09-20 DIAGNOSIS — Z90722 Acquired absence of ovaries, bilateral: Secondary | ICD-10-CM | POA: Diagnosis not present

## 2016-09-20 DIAGNOSIS — Z79899 Other long term (current) drug therapy: Secondary | ICD-10-CM | POA: Insufficient documentation

## 2016-09-20 NOTE — Progress Notes (Signed)
Had recent visit with cardio and had echo done with labs and wanted Dr. B to discuss the iron portion of labs

## 2016-09-20 NOTE — Assessment & Plan Note (Addendum)
#   Iron deficiency anemia secondary to menorrhagia. Most recent hemoglobin 6.2; MCV 68. S/p IV iron x 2. Most recent hemoglobin 13- iron studies  suggestive of iron deficiency- however significant improvement compared to baseline iron studies in June  2017.   # mild leucocytosis- likely reactive.   # Recommend follow-up as planned in December 2017/labs prior.

## 2016-09-20 NOTE — Progress Notes (Signed)
[facility] NOTE  Patient Care Team: Janyth Pupa, DO as PCP - General (Obstetrics and Gynecology); Fredderick Erb; Gyn  CHIEF COMPLAINTS/PURPOSE OF CONSULTATION:   # April 2017- IRON DEFICIENCY ANEMIA [[facility]] s/p IV iron; s/p TAH & BSO [July 2017]  # MENORRHAGIA [[facility]]- on provera.   HISTORY OF PRESENTING ILLNESS:  Jeanette Townsend 45 y.o.  female with iron deficiency secondary to menorraghia is here for follow-up.  Patient interim had hysterectomy she is currently on by mouth iron. Her energy levels are improved. No blood in stools black colored stools. No nausea no vomiting. No weight loss. No blood in urine.  She had recent blood work with her PCP- iron studies- consistent with iron deficiency however noted to have mild leukocytosis white count of 12.5   ROS: A complete 10 point review of system is done which is negative except mentioned above in history of present illness  MEDICAL HISTORY:  Past Medical History:  Diagnosis Date  . Allergy   . Anemia   . Asthma    childhood  . Excessive vaginal bleeding   . Fibroids     SURGICAL HISTORY: # fibroids removal 2012.   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  Social History   Social History  . Marital status: Married    Spouse name: N/A  . Number of children: N/A  . Years of education: N/A   Occupational History  . Not on file.   Social History Main Topics  . Smoking status: Never Smoker  . Smokeless tobacco: Never Used  . Alcohol use No  . Drug use: No  . Sexual activity: Yes   Other Topics Concern  . Not on file   Social History Narrative  . No narrative on file    FAMILY HISTORY: NO family history of any cancer. Family History  Problem Relation Age of Onset  . Hypertension Mother   . Diverticulitis Mother     ALLERGIES:  has No Known Allergies.  MEDICATIONS:  Current Outpatient Prescriptions  Medication Sig Dispense Refill  . ferrous  sulfate 325 (65 FE) MG tablet Take 325 mg by mouth 2 (two) times daily with a meal.    . Multiple Vitamin (MULTI-VITAMINS) TABS Take by mouth.    . vitamin E 400 UNIT capsule Take 400 Units by mouth daily.     No current facility-administered medications for this visit.       Marland Kitchen  PHYSICAL EXAMINATION:   Vitals:   09/20/16 1421  BP: 121/85  Resp: 16  Temp: 99.1 F (37.3 C)   Filed Weights   09/20/16 1421  Weight: 166 lb 7.2 oz (75.5 kg)    GENERAL: Well-nourished well-developed; Alert, no distress and comfortable.  She is alone.  EYES: no pallor.  OROPHARYNX: no thrush or ulceration; good dentition  NECK: supple, no masses felt LYMPH:  no palpable lymphadenopathy in the cervical, axillary or inguinal regions LUNGS: clear to auscultation and  No wheeze or crackles HEART/CVS: regular rate & rhythm and no murmurs; No lower extremity edema ABDOMEN: abdomen soft, non-tender and normal bowel sounds Musculoskeletal:no cyanosis of digits and no clubbing  PSYCH: alert & oriented x 3 with fluent speech NEURO: no focal motor/sensory deficits SKIN:  no rashes or significant lesions  LABORATORY DATA:  I have reviewed the data as listed Lab Results  Component Value Date   WBC 6.0 07/20/2016   HGB 13.1 07/20/2016   HCT 38.8 07/20/2016   MCV  87.5 07/20/2016   PLT 222 07/20/2016    Recent Labs  04/15/16 1549  NA 137  K 3.7  CL 104  CO2 26  GLUCOSE 101*  BUN 8  CREATININE 0.51  CALCIUM 9.2  GFRNONAA >60  GFRAA >60  PROT 7.1  ALBUMIN 3.9  AST 20  ALT 19  ALKPHOS 65  BILITOT 0.5     ASSESSMENT & PLAN:   Iron deficiency anemia due to chronic blood loss # Iron deficiency anemia secondary to menorrhagia. Most recent hemoglobin 6.2; MCV 68. S/p IV iron x 2. Most recent hemoglobin 13- iron studies  suggestive of iron deficiency- however significant improvement compared to baseline iron studies in June  2017.   # mild leucocytosis- likely reactive.   # Recommend  follow-up as planned in December 2017/labs prior.       Cammie Sickle, MD 09/20/2016 5:02 PM

## 2016-11-21 ENCOUNTER — Other Ambulatory Visit: Payer: No Typology Code available for payment source

## 2016-11-28 ENCOUNTER — Ambulatory Visit: Payer: No Typology Code available for payment source | Admitting: Internal Medicine

## 2017-03-08 ENCOUNTER — Encounter: Payer: Self-pay | Admitting: Obstetrics and Gynecology

## 2017-03-08 ENCOUNTER — Ambulatory Visit (INDEPENDENT_AMBULATORY_CARE_PROVIDER_SITE_OTHER): Payer: No Typology Code available for payment source | Admitting: Obstetrics and Gynecology

## 2017-03-08 DIAGNOSIS — D500 Iron deficiency anemia secondary to blood loss (chronic): Secondary | ICD-10-CM

## 2017-03-08 DIAGNOSIS — Z13220 Encounter for screening for lipoid disorders: Secondary | ICD-10-CM

## 2017-03-08 DIAGNOSIS — Z124 Encounter for screening for malignant neoplasm of cervix: Secondary | ICD-10-CM | POA: Diagnosis not present

## 2017-03-08 DIAGNOSIS — Z1329 Encounter for screening for other suspected endocrine disorder: Secondary | ICD-10-CM

## 2017-03-08 DIAGNOSIS — Z01419 Encounter for gynecological examination (general) (routine) without abnormal findings: Secondary | ICD-10-CM | POA: Diagnosis not present

## 2017-03-08 DIAGNOSIS — Z1231 Encounter for screening mammogram for malignant neoplasm of breast: Secondary | ICD-10-CM

## 2017-03-08 DIAGNOSIS — Z131 Encounter for screening for diabetes mellitus: Secondary | ICD-10-CM | POA: Diagnosis not present

## 2017-03-08 DIAGNOSIS — Z1239 Encounter for other screening for malignant neoplasm of breast: Secondary | ICD-10-CM

## 2017-03-08 NOTE — Patient Instructions (Signed)
 Preventive Care 18-39 Years, Female Preventive care refers to lifestyle choices and visits with your health care provider that can promote health and wellness. What does preventive care include?  A yearly physical exam. This is also called an annual well check.  Dental exams once or twice a year.  Routine eye exams. Ask your health care provider how often you should have your eyes checked.  Personal lifestyle choices, including:  Daily care of your teeth and gums.  Regular physical activity.  Eating a healthy diet.  Avoiding tobacco and drug use.  Limiting alcohol use.  Practicing safe sex.  Taking vitamin and mineral supplements as recommended by your health care provider. What happens during an annual well check? The services and screenings done by your health care provider during your annual well check will depend on your age, overall health, lifestyle risk factors, and family history of disease. Counseling  Your health care provider may ask you questions about your:  Alcohol use.  Tobacco use.  Drug use.  Emotional well-being.  Home and relationship well-being.  Sexual activity.  Eating habits.  Work and work environment.  Method of birth control.  Menstrual cycle.  Pregnancy history. Screening  You may have the following tests or measurements:  Height, weight, and BMI.  Diabetes screening. This is done by checking your blood sugar (glucose) after you have not eaten for a while (fasting).  Blood pressure.  Lipid and cholesterol levels. These may be checked every 5 years starting at age 20.  Skin check.  Hepatitis C blood test.  Hepatitis B blood test.  Sexually transmitted disease (STD) testing.  BRCA-related cancer screening. This may be done if you have a family history of breast, ovarian, tubal, or peritoneal cancers.  Pelvic exam and Pap test. This may be done every 3 years starting at age 21. Starting at age 30, this may be done  every 5 years if you have a Pap test in combination with an HPV test. Discuss your test results, treatment options, and if necessary, the need for more tests with your health care provider. Vaccines  Your health care provider may recommend certain vaccines, such as:  Influenza vaccine. This is recommended every year.  Tetanus, diphtheria, and acellular pertussis (Tdap, Td) vaccine. You may need a Td booster every 10 years.  Varicella vaccine. You may need this if you have not been vaccinated.  HPV vaccine. If you are 26 or younger, you may need three doses over 6 months.  Measles, mumps, and rubella (MMR) vaccine. You may need at least one dose of MMR. You may also need a second dose.  Pneumococcal 13-valent conjugate (PCV13) vaccine. You may need this if you have certain conditions and were not previously vaccinated.  Pneumococcal polysaccharide (PPSV23) vaccine. You may need one or two doses if you smoke cigarettes or if you have certain conditions.  Meningococcal vaccine. One dose is recommended if you are age 19-21 years and a first-year college student living in a residence hall, or if you have one of several medical conditions. You may also need additional booster doses.  Hepatitis A vaccine. You may need this if you have certain conditions or if you travel or work in places where you may be exposed to hepatitis A.  Hepatitis B vaccine. You may need this if you have certain conditions or if you travel or work in places where you may be exposed to hepatitis B.  Haemophilus influenzae type b (Hib) vaccine. You may need   this if you have certain risk factors. Talk to your health care provider about which screenings and vaccines you need and how often you need them. This information is not intended to replace advice given to you by your health care provider. Make sure you discuss any questions you have with your health care provider. Document Released: 02/07/2002 Document Revised:  08/31/2016 Document Reviewed: 10/13/2015 Elsevier Interactive Patient Education  2017 Elsevier Inc.  

## 2017-03-08 NOTE — Progress Notes (Signed)
Gynecology Annual Exam  PCP: No PCP Per Patient  Chief Complaint:  Chief Complaint  Patient presents with  . Gynecologic Exam    dark splotches on arms since iron infusion    History of Present Illness: Patient is a 46 y.o. G0P0000 presents for annual exam. The patient has no complaints today.   LMP: Patient's last menstrual period was 02/11/2017 (exact date). Average Interval: regular, 30 days Duration of flow: 5 days Heavy Menses: no Clots: no Intermenstrual Bleeding: no Postcoital Bleeding: no Dysmenorrhea: no   The patient is sexually active. She currently uses Condoms for contraception. She denies dyspareunia.  The patient does perform self breast exams.  There is no notable family history of breast or ovarian cancer in her family.  The patient wears seatbelts: yes.   The patient has regular exercise: not asked.    The patient denies current symptoms of depression.    Review of Systems: Review of Systems  Constitutional: Negative for chills, fever, malaise/fatigue and weight loss.  Respiratory: Negative for cough.   Cardiovascular: Negative for chest pain, palpitations and orthopnea.  Gastrointestinal: Negative for abdominal pain, constipation and diarrhea.  Genitourinary: Positive for urgency. Negative for dysuria, frequency and hematuria.  Skin: Positive for rash. Negative for itching.  Neurological: Negative for dizziness and weakness.  Endo/Heme/Allergies: Bruises/bleeds easily.  Psychiatric/Behavioral: Negative for depression. The patient is not nervous/anxious.     Past Medical History:  Past Medical History:  Diagnosis Date  . Allergy   . Anemia   . Asthma    childhood  . Excessive vaginal bleeding   . Fibroids     Past Surgical History:  Past Surgical History:  Procedure Laterality Date  . DILATATION & CURETTAGE/HYSTEROSCOPY WITH MYOSURE N/A 05/26/2016   Procedure: DILATATION & CURETTAGE/HYSTEROSCOPY WITH MYOSURE RESECTION OF SUBMUCOSAL  FIBROID;  Surgeon: Malachy Mood, MD;  Location: [facility] ORS;  Service: Gynecology;  Laterality: N/A;  . DILATION AND CURETTAGE OF UTERUS  08/2011    Gynecologic History:  Patient's last menstrual period was 02/11/2017 (exact date). Contraception: condoms Last Pap: Results were: no abnormalities  Last mammogram:2 years ago Results were: normal Obstetric History: G0P0000  Family History:  Family History  Problem Relation Age of Onset  . Hypertension Mother   . Diverticulitis Mother     Social History:  Social History   Social History  . Marital status: Married    Spouse name: N/A  . Number of children: N/A  . Years of education: N/A   Occupational History  . Not on file.   Social History Main Topics  . Smoking status: Never Smoker  . Smokeless tobacco: Never Used  . Alcohol use No  . Drug use: No  . Sexual activity: Yes    Birth control/ protection: Condom   Other Topics Concern  . Not on file   Social History Narrative  . No narrative on file    Allergies:  No Known Allergies  Medications: Prior to Admission medications   Medication Sig Start Date End Date Taking? Authorizing Provider  ferrous sulfate 325 (65 FE) MG tablet Take 325 mg by mouth 2 (two) times daily with a meal.    Historical Provider, MD  Multiple Vitamin (MULTI-VITAMINS) TABS Take by mouth.    Historical Provider, MD  vitamin E 400 UNIT capsule Take 400 Units by mouth daily.    Historical Provider, MD    Physical Exam Vitals: Blood pressure 122/80, pulse 77, height 5\' 4"  (1.626 m), weight 166  lb (75.3 kg), last menstrual period 02/11/2017.  General: NAD HEENT: normocephalic, anicteric Thyroid: no enlargement, no palpable nodules Pulmonary: No increased work of breathing, CTAB Cardiovascular: RRR, distal pulses 2+ Breast: Breast symmetrical, no tenderness, no palpable nodules or masses, no skin or nipple retraction present, no nipple discharge.  No axillary or supraclavicular  lymphadenopathy. Abdomen: NABS, soft, non-tender, non-distended.  Umbilicus without lesions.  No hepatomegaly, splenomegaly or masses palpable. No evidence of hernia  Genitourinary:  External: Normal external female genitalia.  Normal urethral meatus, normal  Bartholin's and Skene's glands.    Vagina: Normal vaginal mucosa, no evidence of prolapse.    Cervix: Grossly normal in appearance, no bleeding  Uterus: Non-enlarged, mobile, normal contour.  No CMT  Adnexa: ovaries non-enlarged, no adnexal masses  Rectal: deferred  Lymphatic: no evidence of inguinal lymphadenopathy Extremities: no edema, erythema, or tenderness.  Some bruising bilateral upper extremities that patient noted after iron transfusions Neurologic: Grossly intact Psychiatric: mood appropriate, affect full  Female chaperone present for pelvic and breast  portions of the physical exam    Assessment: 46 y.o. G0P0000 No problem-specific Assessment & Plan notes found for this encounter.   Plan: Problem List Items Addressed This Visit      Other   Iron deficiency anemia due to chronic blood loss   Relevant Orders   CBC With Differential   Ferritin    Other Visit Diagnoses    Screening for malignant neoplasm of cervix       Relevant Orders   PapIG, HPV, rfx 16/18   Screening for diabetes mellitus       Relevant Orders   Comprehensive metabolic panel   TSH   Screening for lipoid disorders       Relevant Orders   Lipid panel   Breast screening       Relevant Orders   MM DIGITAL SCREENING BILATERAL   Encounter for gynecological examination without abnormal finding       Relevant Orders   PapIG, HPV, rfx 16/18   25-Hydroxyvitamin D Lcms D2+D3   CBC With Differential   Comprehensive metabolic panel   Lipid panel   TSH   Ferritin   Thyroid disorder screen       Relevant Orders   TSH      1) Mammogram - recommend yearly screening mammogram.  Mammogram Was ordered today   2) STI screening was offered and  declined  3) ASCCP guidelines and rational discussed.  Patient opts for every 3 years screening interval  4) Contraception - Education given regarding options for contraception, including barrier methods.  5) Routine healthcare maintenance including cholesterol, diabetes screening discussed Ordered today.  Ferritin to check for iron overload        \

## 2017-03-09 ENCOUNTER — Encounter: Payer: Self-pay | Admitting: Obstetrics and Gynecology

## 2017-03-09 LAB — CBC WITH DIFFERENTIAL
BASOS ABS: 0 10*3/uL (ref 0.0–0.2)
Basos: 0 %
EOS (ABSOLUTE): 0.1 10*3/uL (ref 0.0–0.4)
EOS: 1 %
HEMATOCRIT: 41.5 % (ref 34.0–46.6)
Hemoglobin: 13.3 g/dL (ref 11.1–15.9)
IMMATURE GRANULOCYTES: 0 %
Immature Grans (Abs): 0 10*3/uL (ref 0.0–0.1)
Lymphocytes Absolute: 1.7 10*3/uL (ref 0.7–3.1)
Lymphs: 18 %
MCH: 28.6 pg (ref 26.6–33.0)
MCHC: 32 g/dL (ref 31.5–35.7)
MCV: 89 fL (ref 79–97)
MONOS ABS: 0.5 10*3/uL (ref 0.1–0.9)
Monocytes: 6 %
NEUTROS PCT: 75 %
Neutrophils Absolute: 7.1 10*3/uL — ABNORMAL HIGH (ref 1.4–7.0)
RBC: 4.65 x10E6/uL (ref 3.77–5.28)
RDW: 13.3 % (ref 12.3–15.4)
WBC: 9.6 10*3/uL (ref 3.4–10.8)

## 2017-03-09 LAB — COMPREHENSIVE METABOLIC PANEL
ALK PHOS: 67 IU/L (ref 39–117)
ALT: 45 IU/L — AB (ref 0–32)
AST: 31 IU/L (ref 0–40)
Albumin/Globulin Ratio: 1.6 (ref 1.2–2.2)
Albumin: 4.3 g/dL (ref 3.5–5.5)
BUN/Creatinine Ratio: 13 (ref 9–23)
BUN: 8 mg/dL (ref 6–24)
Bilirubin Total: 0.5 mg/dL (ref 0.0–1.2)
CALCIUM: 9.3 mg/dL (ref 8.7–10.2)
CO2: 26 mmol/L (ref 18–29)
CREATININE: 0.6 mg/dL (ref 0.57–1.00)
Chloride: 99 mmol/L (ref 96–106)
GFR calc Af Amer: 129 mL/min/{1.73_m2} (ref >59)
GFR, EST NON AFRICAN AMERICAN: 112 mL/min/{1.73_m2} (ref >59)
GLOBULIN, TOTAL: 2.7 g/dL (ref 1.5–4.5)
GLUCOSE: 97 mg/dL (ref 65–99)
Potassium: 4 mmol/L (ref 3.5–5.2)
Sodium: 136 mmol/L (ref 134–144)
Total Protein: 7 g/dL (ref 6.0–8.5)

## 2017-03-09 LAB — LIPID PANEL
CHOL/HDL RATIO: 2.7 ratio (ref 0.0–4.4)
CHOLESTEROL TOTAL: 164 mg/dL (ref 100–199)
HDL: 60 mg/dL (ref >39)
LDL CALC: 87 mg/dL (ref 0–99)
TRIGLYCERIDES: 83 mg/dL (ref 0–149)
VLDL Cholesterol Cal: 17 mg/dL (ref 5–40)

## 2017-03-09 LAB — TSH: TSH: 1.74 u[IU]/mL (ref 0.450–4.500)

## 2017-03-09 LAB — VITAMIN D 25 HYDROXY (VIT D DEFICIENCY, FRACTURES): VIT D 25 HYDROXY: 17.4 ng/mL — AB (ref 30.0–100.0)

## 2017-03-10 LAB — PAPIG, HPV, RFX 16/18
HPV, HIGH-RISK: NEGATIVE
PAP SMEAR COMMENT: 0

## 2017-03-15 ENCOUNTER — Encounter: Payer: Self-pay | Admitting: Obstetrics and Gynecology

## 2017-03-15 MED ORDER — VITAMIN D (ERGOCALCIFEROL) 1.25 MG (50000 UNIT) PO CAPS: 50000.0000 [IU] | ORAL_CAPSULE | ORAL | 0 refills | Status: DC

## 2017-03-16 ENCOUNTER — Telehealth: Payer: Self-pay

## 2017-03-16 MED ORDER — VITAMIN D (ERGOCALCIFEROL) 1.25 MG (50000 UNIT) PO CAPS: 50000.0000 [IU] | ORAL_CAPSULE | ORAL | 0 refills | Status: AC

## 2017-03-16 NOTE — Telephone Encounter (Signed)
Pt aware Rx has been resent to her pharmacy

## 2017-03-16 NOTE — Telephone Encounter (Signed)
Pt called stating [facility] had sent her a msg that a rx for vitamin D was sent to her pharm.  [facility] hasn't recv'd rx. [phone]

## 2017-04-21 IMAGING — MG MM DIGITAL SCREENING BILAT W/ CAD
4 series · 4 of 4 positions shown · non-contrast
Comparison: Previous exam(s).

CLINICAL DATA: Screening.

EXAM:
DIGITAL SCREENING BILATERAL MAMMOGRAM WITH CAD

[R CC]
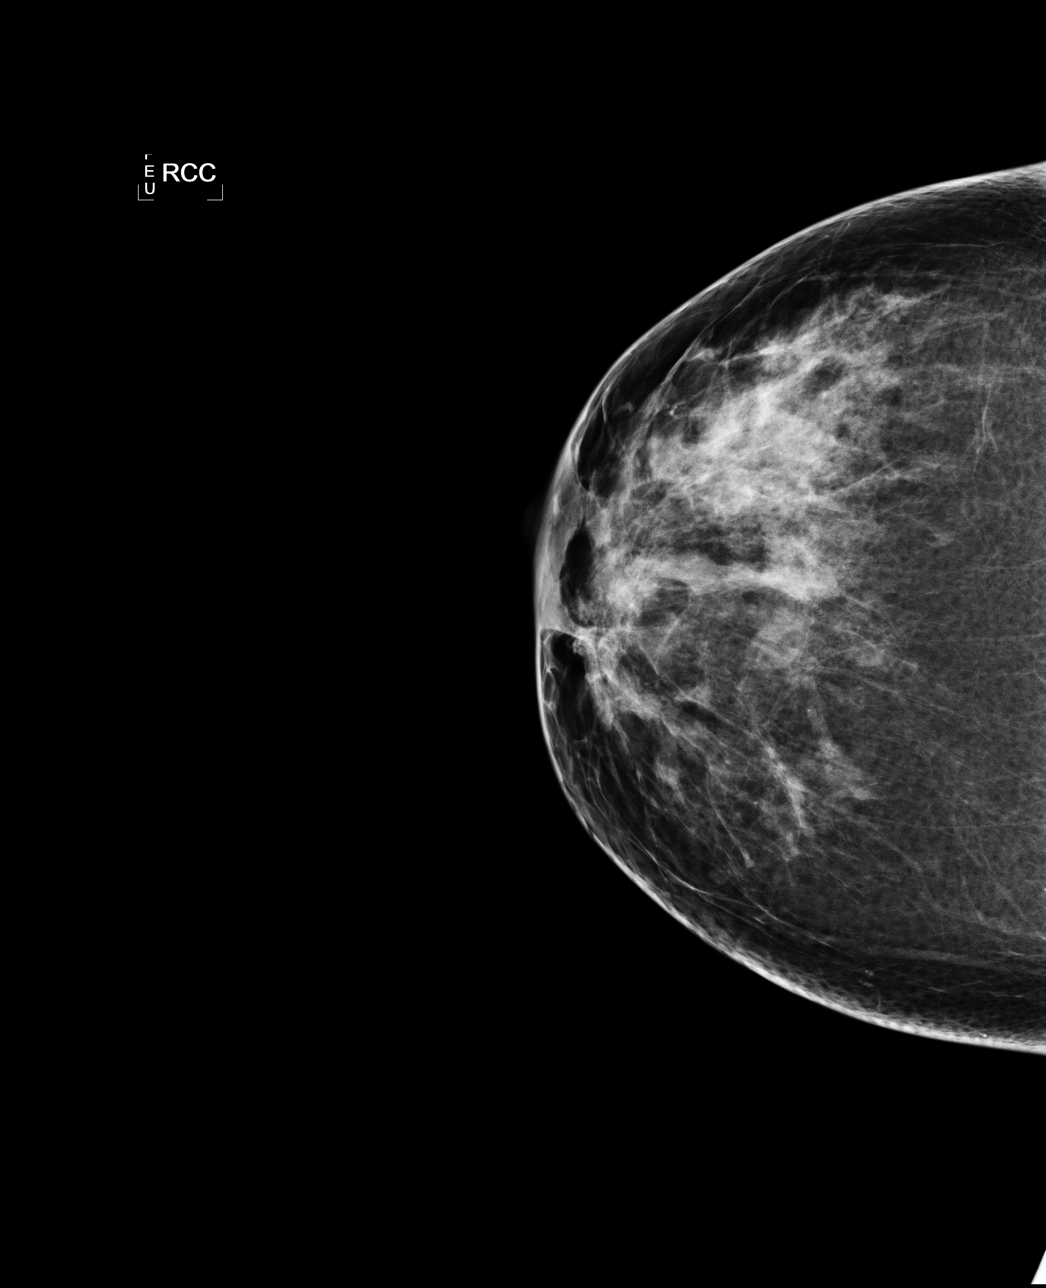

[L CC]
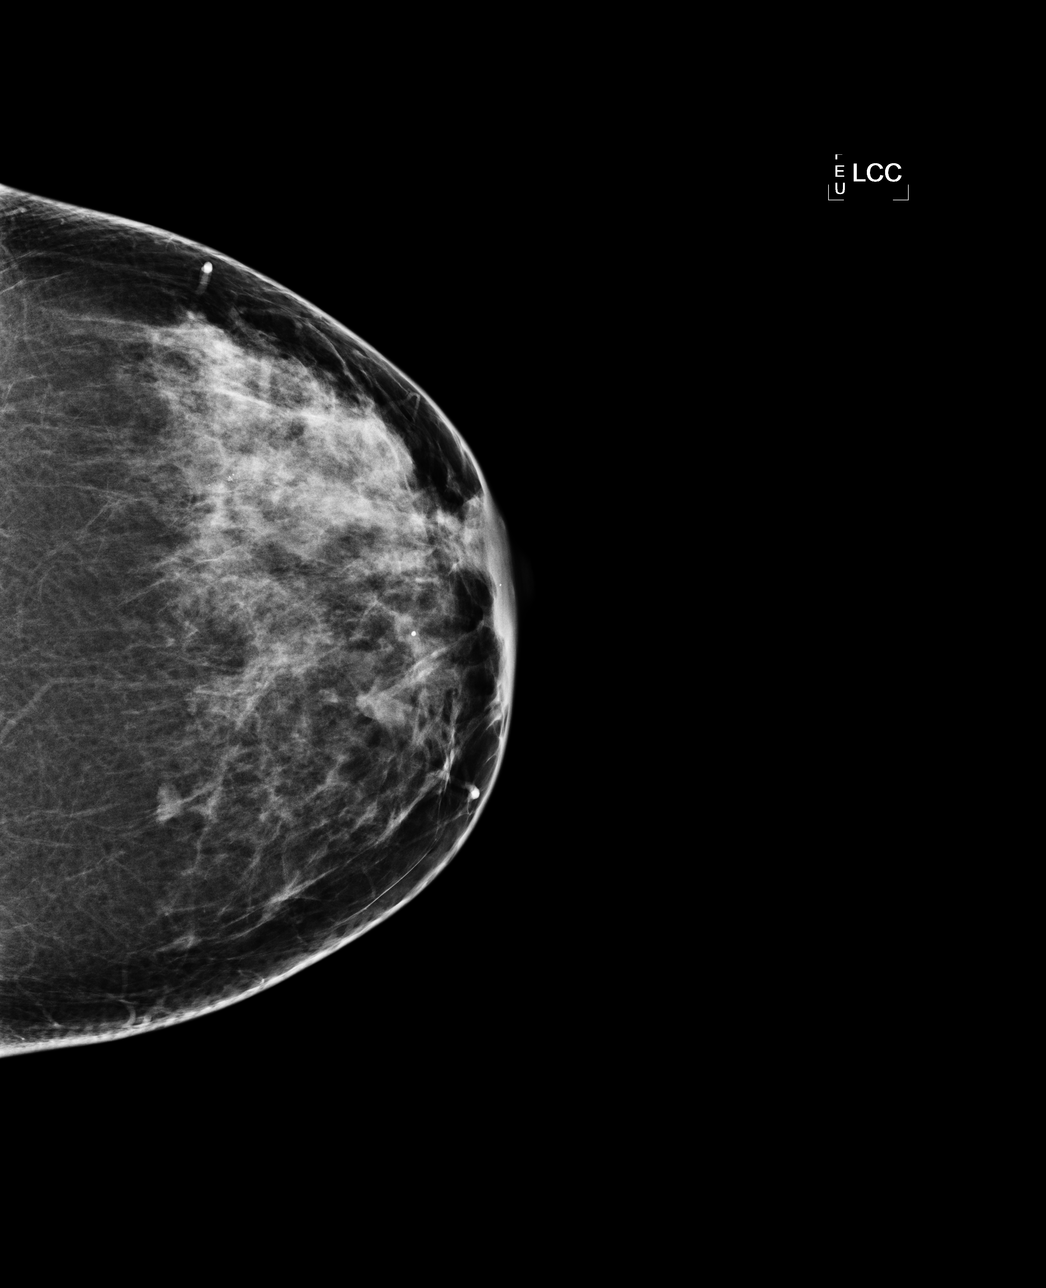

[L MLO]
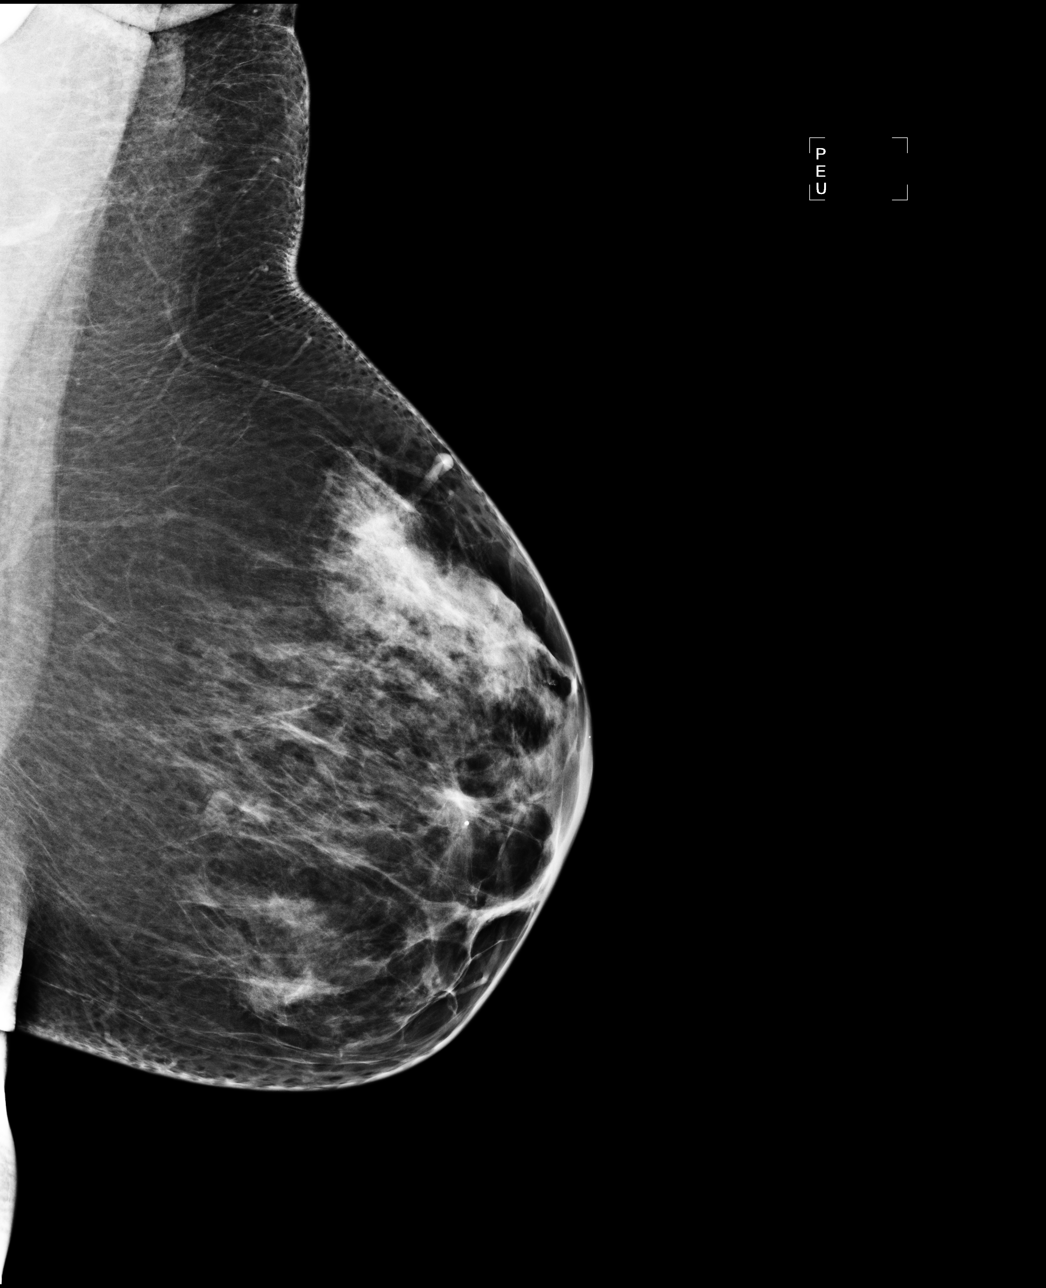

[R MLO]
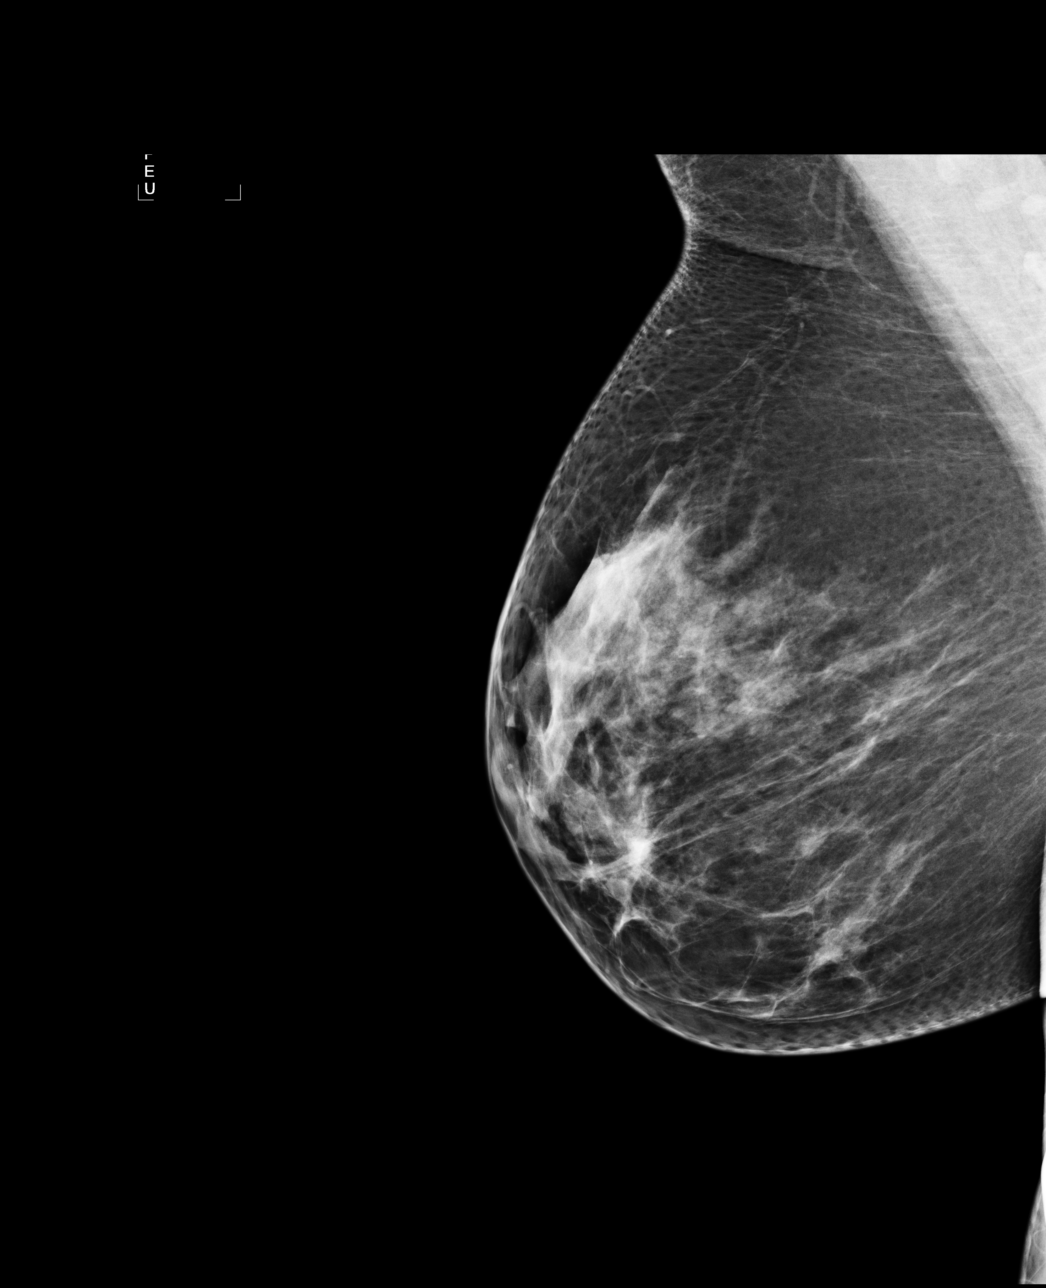

[4 of 4 positions shown; findings below may reference images not displayed]

ACR Breast Density Category c: The breast tissue is heterogeneously
dense, which may obscure small masses.
FINDINGS: There are no findings suspicious for malignancy. Images were
processed with CAD.
IMPRESSION: No mammographic evidence of malignancy. A result letter of this
screening mammogram will be mailed directly to the patient.

RECOMMENDATION:
Screening mammogram in one year. (Code:YJ-2-FEZ)

BI-RADS CATEGORY  1: Negative.

## 2019-12-12 NOTE — Progress Notes (Signed)
Gynecology Annual Exam  PCP: Jeanette Townsend, No Pcp Per  Chief Complaint:  Chief Complaint  Jeanette Townsend presents with  . Gynecologic Exam    irregular bleeding w/sm. clotting    History of Present Illness: Jeanette Townsend is a 48 y.o. G0P0000 presents for annual exam. The Jeanette Townsend has no complaints today.   LMP: Jeanette Townsend's last menstrual period was 11/22/2019 (exact date). Were regular monthly following myomectomy and normal flow.  Skipped menses in October, then had heavy menstrual flow in November and has continued to have light off and on bleeding since. Heavy Menses: yes Clots: yes Intermenstrual Bleeding: yes Postcoital Bleeding: no Dysmenorrhea: yes   The Jeanette Townsend is sexually active. She currently uses none for contraception. She denies dyspareunia.  The Jeanette Townsend does perform self breast exams.  There is no notable family history of breast or ovarian cancer in her family.  The Jeanette Townsend wears seatbelts: yes.   The Jeanette Townsend has regular exercise: not asked.    The Jeanette Townsend denies current symptoms of depression.    Review of Systems: Review of Systems  Constitutional: Negative for chills and fever.  HENT: Negative for congestion.   Respiratory: Negative for cough and shortness of breath.   Cardiovascular: Negative for chest pain and palpitations.  Gastrointestinal: Negative for abdominal pain, constipation, diarrhea, heartburn, nausea and vomiting.  Genitourinary: Negative for dysuria, frequency and urgency.  Skin: Negative for itching and rash.  Neurological: Negative for dizziness and headaches.  Endo/Heme/Allergies: Negative for polydipsia.  Psychiatric/Behavioral: Negative for depression.    Past Medical History:  Past Medical History:  Diagnosis Date  . Allergy   . Anemia   . Asthma    childhood  . Excessive vaginal bleeding   . Fibroids     Past Surgical History:  Past Surgical History:  Procedure Laterality Date  . DILATATION & CURETTAGE/HYSTEROSCOPY WITH MYOSURE N/A  05/26/2016   Procedure: DILATATION & CURETTAGE/HYSTEROSCOPY WITH MYOSURE RESECTION OF SUBMUCOSAL FIBROID;  Surgeon: Malachy Mood, MD;  Location: [facility] ORS;  Service: Gynecology;  Laterality: N/A;  . DILATION AND CURETTAGE OF UTERUS  08/2011    Gynecologic History:  Jeanette Townsend's last menstrual period was 11/22/2019 (exact date). Last Pap: Results were: 03/08/2017 NIL HPV negative Last mammogram: 11/04/2015 Results were: BI-RAD I  Obstetric History: G0P0000  Family History:  Family History  Problem Relation Age of Onset  . Hypertension Mother   . Diverticulitis Mother     Social History:  Social History   Socioeconomic History  . Marital status: Married    Spouse name: Not on file  . Number of children: Not on file  . Years of education: Not on file  . Highest education level: Not on file  Occupational History  . Not on file  Tobacco Use  . Smoking status: Never Smoker  . Smokeless tobacco: Never Used  Substance and Sexual Activity  . Alcohol use: No  . Drug use: No  . Sexual activity: Yes    Birth control/protection: Condom  Other Topics Concern  . Not on file  Social History Narrative  . Not on file   Social Determinants of Health   Financial Resource Strain:   . Difficulty of Paying Living Expenses: Not on file  Food Insecurity:   . Worried About Charity fundraiser in the Last Year: Not on file  . Ran Out of Food in the Last Year: Not on file  Transportation Needs:   . Lack of Transportation (Medical): Not on file  . Lack of Transportation (Non-Medical):  Not on file  Physical Activity:   . Days of Exercise per Week: Not on file  . Minutes of Exercise per Session: Not on file  Stress:   . Feeling of Stress : Not on file  Social Connections:   . Frequency of Communication with Friends and Family: Not on file  . Frequency of Social Gatherings with Friends and Family: Not on file  . Attends Religious Services: Not on file  . Active Member of Clubs or  Organizations: Not on file  . Attends Archivist Meetings: Not on file  . Marital Status: Not on file  Intimate Partner Violence:   . Fear of Current or Ex-Partner: Not on file  . Emotionally Abused: Not on file  . Physically Abused: Not on file  . Sexually Abused: Not on file    Allergies:  No Known Allergies  Medications: Prior to Admission medications   Medication Sig Start Date End Date Taking? Authorizing Provider  ferrous sulfate 325 (65 FE) MG tablet Take 325 mg by mouth 2 (two) times daily with a meal.    [provider]  Multiple Vitamin (MULTI-VITAMINS) TABS Take by mouth.    [provider]  vitamin E 400 UNIT capsule Take 400 Units by mouth daily.    [provider]    Physical Exam Vitals: Blood pressure 126/88, pulse (!) 105, height 5\' 4"  (1.626 m), weight 178 lb (80.7 kg), last menstrual period 11/22/2019.   General: NAD HEENT: normocephalic, anicteric Thyroid: no enlargement, no palpable nodules Pulmonary: No increased work of breathing, CTAB Cardiovascular: RRR, distal pulses 2+ Breast: Breast symmetrical, no tenderness, no palpable nodules or masses, no skin or nipple retraction present, no nipple discharge.  No axillary or supraclavicular lymphadenopathy. Abdomen: NABS, soft, non-tender, non-distended.  Umbilicus without lesions.  No hepatomegaly, splenomegaly or masses palpable. No evidence of hernia  Genitourinary:  External: Normal external female genitalia.  Normal urethral meatus, normal Bartholin's and Skene's glands.    Vagina: Normal vaginal mucosa, no evidence of prolapse.    Cervix: Grossly normal in appearance, no bleeding  Uterus: Non-enlarged, mobile, normal contour.  No CMT  Adnexa: ovaries non-enlarged, no adnexal masses  Rectal: deferred  Lymphatic: no evidence of inguinal lymphadenopathy Extremities: no edema, erythema, or tenderness Neurologic: Grossly intact Psychiatric: mood appropriate, affect  full  Female chaperone present for pelvic and breast  portions of the physical exam    Assessment: 49 y.o. G0P0000 routine annual exam  Plan: Problem List Items Addressed This Visit    None    Visit Diagnoses    Encounter for gynecological examination without abnormal finding    -  Primary   Breast screening       Relevant Orders   MM 3D SCREEN BREAST BILATERAL   Abnormal uterine bleeding       Relevant Orders   PCOS panel (TSH+PRL+FSH+TESTT+LH+DHEA S...)   US Transvaginal Non-OB   CBC   Screening for malignant neoplasm of cervix       Relevant Orders   Cytology - PAP      1) Mammogram - recommend yearly screening mammogram.  Mammogram Was ordered today   2) STI screening  was notoffered and therefore not obtained  3) ASCCP guidelines and rational discussed.  Jeanette Townsend opts for yearly screening interval  4) Contraception - the Jeanette Townsend is currently using  condoms.  She is happy with her current form of contraception and plans to continue  5) Colonoscopy --  Age 78  6)  Routine healthcare maintenance including cholesterol, diabetes screening discussed managed by PCP  7) AUB  - CBC - given history of prior submucosal leiomyoma will obtain TVUS to evaluate for recurrence  8)  Return in about 2 weeks (around 12/27/2019) for gyn and TVUS follow up.   Malachy Mood, MD, [facility], [facility] 12/13/2019, 9:47 AM

## 2019-12-13 ENCOUNTER — Other Ambulatory Visit (HOSPITAL_COMMUNITY)
Admission: RE | Admit: 2019-12-13 | Discharge: 2019-12-13 | Disposition: A | Discharge status: Home / Self Care | Payer: PRIVATE HEALTH INSURANCE | Source: Ambulatory Visit | Attending: Obstetrics and Gynecology | Admitting: Obstetrics and Gynecology

## 2019-12-13 ENCOUNTER — Encounter: Payer: Self-pay | Admitting: Obstetrics and Gynecology

## 2019-12-13 ENCOUNTER — Ambulatory Visit (INDEPENDENT_AMBULATORY_CARE_PROVIDER_SITE_OTHER): Payer: No Typology Code available for payment source | Admitting: Obstetrics and Gynecology

## 2019-12-13 ENCOUNTER — Other Ambulatory Visit: Payer: Self-pay

## 2019-12-13 VITALS — BP 126/88 | HR 105 | Ht 64.0 in | Wt 178.0 lb

## 2019-12-13 DIAGNOSIS — Z01419 Encounter for gynecological examination (general) (routine) without abnormal findings: Secondary | ICD-10-CM

## 2019-12-13 DIAGNOSIS — Z1239 Encounter for other screening for malignant neoplasm of breast: Secondary | ICD-10-CM

## 2019-12-13 DIAGNOSIS — Z124 Encounter for screening for malignant neoplasm of cervix: Secondary | ICD-10-CM

## 2019-12-13 DIAGNOSIS — N939 Abnormal uterine and vaginal bleeding, unspecified: Secondary | ICD-10-CM

## 2019-12-13 NOTE — Patient Instructions (Signed)
[facility] [address] [place] [address]  [facility]  [address] [place] [address]  Phone: [phone]  

## 2019-12-20 LAB — TSH+PRL+FSH+TESTT+LH+DHEA S...
17-Hydroxyprogesterone: 29 ng/dL
Androstenedione: 67 ng/dL (ref 41–262)
DHEA-SO4: 72.6 ug/dL (ref 41.2–243.7)
FSH: 11.9 m[IU]/mL
LH: 12.4 m[IU]/mL
Prolactin: 13.1 ng/mL (ref 4.8–23.3)
TSH: 1.67 u[IU]/mL (ref 0.450–4.500)
Testosterone, Free: 1.2 pg/mL (ref 0.0–4.2)
Testosterone: 7 ng/dL — ABNORMAL LOW (ref 8–48)

## 2019-12-20 LAB — CBC
Hematocrit: 27 % — ABNORMAL LOW (ref 34.0–46.6)
Hemoglobin: 7.6 g/dL — ABNORMAL LOW (ref 11.1–15.9)
MCH: 19.3 pg — ABNORMAL LOW (ref 26.6–33.0)
MCHC: 28.1 g/dL — ABNORMAL LOW (ref 31.5–35.7)
MCV: 69 fL — ABNORMAL LOW (ref 79–97)
Platelets: 516 10*3/uL — ABNORMAL HIGH (ref 150–450)
RBC: 3.93 x10E6/uL (ref 3.77–5.28)
RDW: 17.9 % — ABNORMAL HIGH (ref 11.7–15.4)
WBC: 7.7 10*3/uL (ref 3.4–10.8)

## 2019-12-23 ENCOUNTER — Other Ambulatory Visit: Payer: Self-pay | Admitting: Obstetrics and Gynecology

## 2019-12-23 MED ORDER — FERROUS SULFATE 325 (65 FE) MG PO TABS: 325.0000 mg | ORAL_TABLET | Freq: Two times a day (BID) | ORAL | 1 refills | Status: DC

## 2019-12-24 ENCOUNTER — Other Ambulatory Visit: Payer: Self-pay | Admitting: Obstetrics & Gynecology

## 2019-12-24 LAB — CYTOLOGY - PAP
Comment: NEGATIVE
Diagnosis: NEGATIVE
High risk HPV: NEGATIVE

## 2019-12-26 ENCOUNTER — Other Ambulatory Visit: Payer: Self-pay

## 2019-12-26 ENCOUNTER — Ambulatory Visit
Admission: RE | Admit: 2019-12-26 | Discharge: 2019-12-26 | Disposition: A | Discharge status: Home / Self Care | Payer: PRIVATE HEALTH INSURANCE | Source: Ambulatory Visit | Attending: Obstetrics and Gynecology | Admitting: Obstetrics and Gynecology

## 2019-12-26 DIAGNOSIS — Z1239 Encounter for other screening for malignant neoplasm of breast: Secondary | ICD-10-CM

## 2019-12-26 DIAGNOSIS — Z1231 Encounter for screening mammogram for malignant neoplasm of breast: Secondary | ICD-10-CM | POA: Diagnosis not present

## 2020-01-03 ENCOUNTER — Ambulatory Visit (INDEPENDENT_AMBULATORY_CARE_PROVIDER_SITE_OTHER): Payer: No Typology Code available for payment source

## 2020-01-03 ENCOUNTER — Telehealth: Payer: Self-pay | Admitting: Obstetrics and Gynecology

## 2020-01-03 ENCOUNTER — Ambulatory Visit (INDEPENDENT_AMBULATORY_CARE_PROVIDER_SITE_OTHER): Payer: No Typology Code available for payment source | Admitting: Obstetrics and Gynecology

## 2020-01-03 ENCOUNTER — Other Ambulatory Visit: Payer: Self-pay

## 2020-01-03 ENCOUNTER — Encounter: Payer: Self-pay | Admitting: Obstetrics and Gynecology

## 2020-01-03 VITALS — BP 130/90 | Ht 64.0 in | Wt 179.0 lb

## 2020-01-03 DIAGNOSIS — N939 Abnormal uterine and vaginal bleeding, unspecified: Secondary | ICD-10-CM

## 2020-01-03 DIAGNOSIS — D252 Subserosal leiomyoma of uterus: Secondary | ICD-10-CM

## 2020-01-03 DIAGNOSIS — D649 Anemia, unspecified: Secondary | ICD-10-CM

## 2020-01-03 DIAGNOSIS — R9389 Abnormal findings on diagnostic imaging of other specified body structures: Secondary | ICD-10-CM | POA: Diagnosis not present

## 2020-01-03 DIAGNOSIS — N83201 Unspecified ovarian cyst, right side: Secondary | ICD-10-CM | POA: Diagnosis not present

## 2020-01-03 NOTE — Telephone Encounter (Signed)
Cell Saver conf# [id number] added to case.

## 2020-01-03 NOTE — Telephone Encounter (Signed)
Crystal notified Mirena is needed.

## 2020-01-03 NOTE — Telephone Encounter (Signed)
Patient is aware of consents day of surgery, Pre-admit Testing phone interview on 1/11, COVID testing on 1/12, and OR on 01/09/20. Patient is aware to quarantine after COVID testing. Patient has already received a call from the [facility]. Patient is aware she may receive a call from the [facility]. Patient confirmed [facility] insurance.

## 2020-01-03 NOTE — Telephone Encounter (Signed)
-----   Message from Malachy Mood, MD sent at 01/03/2020  9:34 AM EST ----- Regarding: Surgery Surgery Booking Request Patient Full Name:  Jeanette Townsend  MRN: [id number]  DOB: March 31, 1971  Surgeon: Malachy Mood, MD  Requested Surgery Date and Time: 01/08/2019 Primary Diagnosis AND Code: AUB N93.9 Secondary Diagnosis and Code: Anemia D64.9, thickened endometrium R93.89 Surgical Procedure: Hysteroscopy D&C, Mirena IUD placement L&D Notification: No Admission Status: same day surgery Length of Surgery: 1hr Special Case Needs: No H&P: No day of is fine Phone Interview???:  Yes Interpreter: No Language:  Medical Clearance:  No Special Scheduling Instructions: Cell saver on standby Any known health/anesthesia issues, diabetes, sleep apnea, latex allergy, defibrillator/pacemaker?: No Acuity: P2   (P1 highest, P2 delay may cause harm, P3 low, elective gyn, P4 lowest) significant anemia and Jehovah;s wittness

## 2020-01-03 NOTE — Telephone Encounter (Signed)
Lmtrc

## 2020-01-03 NOTE — Telephone Encounter (Signed)
-----   Message from Malachy Mood, MD sent at 01/03/2020  9:34 AM EST ----- Regarding: Surgery Surgery Booking Request Patient Full Name:  Jeanette Townsend  MRN: [id number]  DOB: 05-07-1971  Surgeon: Malachy Mood, MD  Requested Surgery Date and Time: 01/08/2019 Primary Diagnosis AND Code: AUB N93.9 Secondary Diagnosis and Code: Anemia D64.9, thickened endometrium R93.89 Surgical Procedure: Hysteroscopy D&C, Mirena IUD placement L&D Notification: No Admission Status: same day surgery Length of Surgery: 1hr Special Case Needs: No H&P: No day of is fine Phone Interview???:  Yes Interpreter: No Language:  Medical Clearance:  No Special Scheduling Instructions: Cell saver on standby Any known health/anesthesia issues, diabetes, sleep apnea, latex allergy, defibrillator/pacemaker?: No Acuity: P2   (P1 highest, P2 delay may cause harm, P3 low, elective gyn, P4 lowest) significant anemia and Jehovah;s wittness

## 2020-01-03 NOTE — Progress Notes (Signed)
Gynecology Ultrasound Follow Up  Chief Complaint:  Chief Complaint  Patient presents with  . Follow-up    u/s     History of Present Illness: Patient is a 49 y.o. female who presents today for ultrasound evaluation of AUB .  Ultrasound demonstrates the following findgins Adnexa: Simple 3.9cm cyst right ovary, normal left ovary Uterus: At least one 2cm subserosal fibroid with perhaps some smaller intramural fibroids with endometrial stripe 74mm and homogenous Additional: Nabothian cyst  Review of Systems: Review of Systems  Constitutional: Negative.   Genitourinary: Negative.   Skin: Negative.   Endo/Heme/Allergies: Does not bruise/bleed easily.    Past Medical History:  Past Medical History:  Diagnosis Date  . Allergy   . Anemia   . Asthma    childhood  . Excessive vaginal bleeding   . Fibroids     Past Surgical History:  Past Surgical History:  Procedure Laterality Date  . DILATATION & CURETTAGE/HYSTEROSCOPY WITH MYOSURE N/A 05/26/2016   Procedure: DILATATION & CURETTAGE/HYSTEROSCOPY WITH MYOSURE RESECTION OF SUBMUCOSAL FIBROID;  Surgeon: Malachy Mood, MD;  Location: [facility] ORS;  Service: Gynecology;  Laterality: N/A;  . DILATION AND CURETTAGE OF UTERUS  08/2011    Gynecologic History:  Patient's last menstrual period was 12/18/2019.  Family History:  Family History  Problem Relation Age of Onset  . Hypertension Mother   . Diverticulitis Mother   . Breast cancer Neg Hx     Social History:  Social History   Socioeconomic History  . Marital status: Married    Spouse name: Not on file  . Number of children: Not on file  . Years of education: Not on file  . Highest education level: Not on file  Occupational History  . Not on file  Tobacco Use  . Smoking status: Never Smoker  . Smokeless tobacco: Never Used  Substance and Sexual Activity  . Alcohol use: No  . Drug use: No  . Sexual activity: Yes    Birth control/protection: Condom  Other Topics  Concern  . Not on file  Social History Narrative  . Not on file   Social Determinants of Health   Financial Resource Strain:   . Difficulty of Paying Living Expenses: Not on file  Food Insecurity:   . Worried About Charity fundraiser in the Last Year: Not on file  . Ran Out of Food in the Last Year: Not on file  Transportation Needs:   . Lack of Transportation (Medical): Not on file  . Lack of Transportation (Non-Medical): Not on file  Physical Activity:   . Days of Exercise per Week: Not on file  . Minutes of Exercise per Session: Not on file  Stress:   . Feeling of Stress : Not on file  Social Connections:   . Frequency of Communication with Friends and Family: Not on file  . Frequency of Social Gatherings with Friends and Family: Not on file  . Attends Religious Services: Not on file  . Active Member of Clubs or Organizations: Not on file  . Attends Archivist Meetings: Not on file  . Marital Status: Not on file  Intimate Partner Violence:   . Fear of Current or Ex-Partner: Not on file  . Emotionally Abused: Not on file  . Physically Abused: Not on file  . Sexually Abused: Not on file    Allergies:  No Known Allergies  Medications: Prior to Admission medications   Medication Sig Start Date End Date Taking? Authorizing  Provider  Ascorbic Acid (VITAMIN C PO) Take by mouth.   Yes [provider]  Cholecalciferol 25 MCG (1000 UT) tablet Take by mouth.   Yes [provider]  ferrous sulfate (FERROUSUL) 325 (65 FE) MG tablet Take 1 tablet (325 mg total) by mouth 2 (two) times daily. 12/23/19  Yes Malachy Mood, MD    Physical Exam Vitals: Blood pressure 130/90, height 5\' 4"  (1.626 m), weight 179 lb (81.2 kg), last menstrual period 12/18/2019.  General: NAD HEENT: normocephalic, anicteric Pulmonary: No increased work of breathing Extremities: no edema, erythema, or tenderness Neurologic: Grossly intact, normal gait Psychiatric: mood  appropriate, affect full  US Transvaginal Non-OB  Result Date: 01/03/2020 Patient Name: Jeanette Townsend DOB: 04/30/1971 MRN: [id number] ULTRASOUND REPORT Location: [facility] Date of Service: 01/03/2020 Indications:Abnormal Uterine Bleeding Findings: The uterus is anteverted and measures 9.4 x 5.5 x 4.7 cm. Echo texture is heterogenous with evidence of focal masses. Within the uterus are multiple suspected fibroids measuring: Fibroid 1:12.4 x 10.2 x 18.5 mm subserosal left There are questionably ill-defined fibroids in the uterus up to 2.3 cm. The Endometrium measures 18.9 mm. There is a fluid collection in the posterior portion of the cervix measuring 36.5 x 9.6 x 20.9 mm. This may be nabothian cysts vs. other Right Ovary measures 4.4 x 3.6 x 3.7 cm. It is not normal in appearance. There is a simple cyst in the right ovary measuring 38.8 x 32.1 x 31.3 mm. Left Ovary measures 2.9 x 2.5 x 1.3 cm. It is normal in appearance. Survey of the adnexa demonstrates no adnexal masses. There is no free fluid in the cul de sac. Impression: 1. The endometrium is thick. There are no obvious endometrial polyps or submucosal fibroids. 2. The myometrium is heterogenous with possible ill-defined fibroids. 3. There is a fluid collection in the posterior cervix. This may represent a nabothian cyst. 4. Normal appearing left ovary. 5. There is a 3.9 cm simple cyst in the right ovary. Recommendations: 1.Clinical correlation with the patient's History and Physical Exam. Gweneth Dimitri, RT Images reviewed.  The uterine lining is thickened, there are no focal endometrial abnormalities.  There is at least one subserosal fibroid measuring just under 2 cm.  Small nabothian cyst.  Simple cyst right ovary. Malachy Mood, MD, [facility], [facility] 01/03/2020, 9:11 AM   MM 3D SCREEN BREAST BILATERAL  Result Date: 12/26/2019 CLINICAL DATA:  Screening. EXAM: DIGITAL SCREENING BILATERAL MAMMOGRAM WITH TOMO AND CAD  COMPARISON:  Previous exam(s). ACR Breast Density Category c: The breast tissue is heterogeneously dense, which may obscure small masses. FINDINGS: There are no findings suspicious for malignancy. Images were processed with CAD. IMPRESSION: No mammographic evidence of malignancy. A result letter of this screening mammogram will be mailed directly to the patient. RECOMMENDATION: Screening mammogram in one year. (Code:SM-B-01Y) BI-RADS CATEGORY  1: Negative. Electronically Signed   By: Lovey Newcomer M.D.   On: 12/26/2019 09:25     Assessment: 49 y.o. G0P0000 No problem-specific Assessment & Plan notes found for this encounter.   Plan: Problem List Items Addressed This Visit    None    Visit Diagnoses    Abnormal uterine bleeding    -  Primary   Anemia, unspecified type       Thickened endometrium          1) AUB - no clear submucosal fibroid on study today to suggest recurrence.  However, thickened endometrium.  Suspect AUB-O vs AUB-P.  Will proceed with hysteroscopy D&C with Mirena IUD placement as patient hesitant to try po progestin given prior issues with melasma .  Given size, simple appearance, and asymptomatic no follow up ovarian cyst incidentally imaged today - given anemia will see if availability on 01/08/2019  2) A total of 15 minutes were spent in face-to-face contact with the patient during this encounter with over half of that time devoted to counseling and coordination of care.  3) Return if symptoms worsen or fail to improve.    Malachy Mood, MD, [facility], [facility] 01/03/2020, 9:31 AM

## 2020-01-06 ENCOUNTER — Other Ambulatory Visit: Payer: Self-pay

## 2020-01-06 ENCOUNTER — Encounter
Admission: RE | Admit: 2020-01-06 | Discharge: 2020-01-06 | Disposition: A | Discharge status: Home / Self Care | Payer: No Typology Code available for payment source | Source: Ambulatory Visit | Attending: Obstetrics and Gynecology | Admitting: Obstetrics and Gynecology

## 2020-01-06 NOTE — Pre-Procedure Instructions (Signed)
Echo complete9/11/2016 [place] Component Name Value Ref Range  LV Ejection Fraction (%) 55   Aortic Valve Stenosis Grade none   Aortic Valve Regurgitation Grade none   Aortic Valve Max Velocity (m/s) 1.5 m/sec   Aortic Valve Stenosis Mean Gradient (mmHg) 5.0 mmHg   Mitral Valve Regurgitation Grade trivial   Mitral Valve Stenosis Grade none   Tricuspid Valve Regurgitation Grade trivial   Tricuspid Valve Regurgitation Max Velocity (m/s) 2.1 m/sec   Right Ventricle Systolic Pressure (mmHg) [id number] mmHg   LV End Diastolic Diameter (cm) 4.9 cm  LV End Systolic Diameter (cm) 3.3 cm  LV Septum Wall Thickness (cm) 0.78 cm  LV Posterior Wall Thickness (cm) 0.79 cm  Left Atrium Diameter (cm) 3.1 cm  Result Narrative  [facility] [id number]  [facility]: [id number]  [address] [place], [place] [address]Date: 09/06/2016 01:25 PM  Adult Female Age: 49 yrs ECHOCARDIOGRAM REPORTOutpatient  KC::KCWC STUDY:CHEST WALL TAPE:0000:00: 0:00:00 MD1:CALLWOOD, DWAYNE DENNIS  ECHO:Yes DOPPLER:YesFILE:0000-000-000 BP: 110/60 mmHg COLOR:YesCONTRAST:No MACHINE:[place] Height: 64 in RV BIOPSY:No 3D:NoSOUND QLTY:ModerateWeight: 167 lb  MEDIUM:None BSA: 1.8 m2  ___________________________________________________________________________________________  HISTORY:Palpitations  Tachycardia REASON:Assess, LV function INDICATION:R00.0 Tachycardia, unspecified, R00.2  Palpitations ___________________________________________________________________________________________  ECHOCARDIOGRAPHIC MEASUREMENTS 2D DIMENSIONS AORTA ValuesNormal RangeMAIN PAValuesNormal Range Annulus:nm* [2.1 - 2.5]PA Main:nm* [1.5 - 2.1] Aorta Sin:nm* [2.7 - 3.3] RIGHT VENTRICLE ST Junction:nm* [2.3 - 2.9]RV Base:3.3 cm[ < 4.2] Asc.Aorta:nm* [2.3 - 3.1] RV Mid:nm* [ < 3.5]  LEFT VENTRICLERV Length:nm* [ < 8.6] LVIDd:4.9 cm[3.9 - 5.3] INFERIOR VENA CAVA LVIDs:3.3 cmMax. IVC:nm* [ <= 2.1]  FS:31.9 %[> 25]Min. IVC:nm* SWT:0.78 cm [0.5 - 0.9] ------------------ PWT:0.79 cm [0.5 - 0.9] nm* - not measured  LEFT ATRIUM LA Diam:3.1 cm[2.7 - 3.8] LA A4C Area:nm* [ < 20] LA Volume:nm* [22 - 52] ___________________________________________________________________________________________  ECHOCARDIOGRAPHIC DESCRIPTIONS  AORTIC ROOT Size:Normal Dissection:INDETERM FOR DISSECTION  AORTIC VALVE Leaflets:Tricuspid Morphology:Normal Mobility:Fully mobile  LEFT VENTRICLE Size:NormalAnterior:Normal  Contraction:Normal Lateral:Normal Closest EF:>55% (Estimated)Septal:Normal  LV Masses:No Masses Apical:Normal  [id number] Posterior:Normal Dias.FxClass:Normal  MITRAL VALVE Leaflets:NormalMobility:Fully mobile Morphology:Normal  LEFT  ATRIUM Size:Normal LA Masses:No masses  IA Septum:Normal IAS  MAIN PA Size:Normal  PULMONIC VALVE Morphology:NormalMobility:Fully mobile  RIGHT VENTRICLE  RV Masses:No Masses Size:Normal  Free Wall:Normal Contraction:Normal  TRICUSPID VALVE Leaflets:NormalMobility:Fully mobile Morphology:Normal  RIGHT ATRIUM Size:NormalRA Other:None  RA Mass:No masses  PERICARDIUM  Fluid:No effusion  INFERIOR VENACAVA Size:Normal Normal respiratory collapse  _____________________________________________________________________  DOPPLER ECHO and OTHER SPECIAL PROCEDURES  Aortic:No AR No AS 147.0 cm/sec peak vel 8.6 mmHg peak grad 5.0 mmHg mean grad2.3 cm^2 by DOPPLER   Mitral:TRIVIAL MRNo MS 4.5 cm^2 by DOPPLER MV Inflow E Vel=82.4 cm/sec MV Annulus E'Vel=10.3 cm/sec E/E'Ratio=8.0  Tricuspid:TRIVIAL TRNo TS 209.0 cm/sec peak TR vel22.5 mmHg peak RV pressure  Pulmonary:TRIVIAL PRNo PS    ___________________________________________________________________________________________  INTERPRETATION NORMAL LEFT VENTRICULAR SYSTOLIC FUNCTION WITH AN ESTIMATED EF = >55 % NORMAL RIGHT VENTRICULAR SYSTOLIC FUNCTION TRIVIAL REGURGITATION NOTED (See above) NO VALVULAR STENOSIS  ___________________________________________________________________________________________  Electronically signed by: Lujean Amel, MD on 09/08/2016 12:00 PM Performed By: Johnathan Hausen, RDCS, RVT Ordering Physician: Lujean Amel ___________________________________________________________________________________________  Status Results Details    Unavailable

## 2020-01-07 ENCOUNTER — Other Ambulatory Visit
Admission: RE | Admit: 2020-01-07 | Discharge: 2020-01-07 | Disposition: A | Discharge status: Home / Self Care | Payer: No Typology Code available for payment source | Source: Ambulatory Visit | Attending: Obstetrics and Gynecology | Admitting: Obstetrics and Gynecology

## 2020-01-07 DIAGNOSIS — Z20822 Contact with and (suspected) exposure to covid-19: Secondary | ICD-10-CM | POA: Diagnosis not present

## 2020-01-07 DIAGNOSIS — Z01812 Encounter for preprocedural laboratory examination: Secondary | ICD-10-CM | POA: Diagnosis present

## 2020-01-07 LAB — CBC
HCT: 32.9 % — ABNORMAL LOW (ref 36.0–46.0)
Hemoglobin: 9.3 g/dL — ABNORMAL LOW (ref 12.0–15.0)
MCH: 22 pg — ABNORMAL LOW (ref 26.0–34.0)
MCHC: 28.3 g/dL — ABNORMAL LOW (ref 30.0–36.0)
MCV: 77.8 fL — ABNORMAL LOW (ref 80.0–100.0)
Platelets: 442 10*3/uL — ABNORMAL HIGH (ref 150–400)
RBC: 4.23 MIL/uL (ref 3.87–5.11)
RDW: 30.4 % — ABNORMAL HIGH (ref 11.5–15.5)
WBC: 9.2 10*3/uL (ref 4.0–10.5)
nRBC: 0 % (ref 0.0–0.2)

## 2020-01-07 LAB — SARS CORONAVIRUS 2 (TAT 6-24 HRS): SARS Coronavirus 2: NEGATIVE

## 2020-01-08 NOTE — OR Nursing (Signed)
Colleen CNM, states that pt was not notified for surgery time or npo status for surgery tomorrow. Per SDS Jenny,RN pt to arrive at 2pm tomorrow. NPO at 430 am. Needs to finish carb drink 3 hours prior to arrival (by 12 noon). Pt called and notified. Pt verbalized understanding.

## 2020-01-09 ENCOUNTER — Ambulatory Visit
Admission: RE | Admit: 2020-01-09 | Discharge: 2020-01-09 | Disposition: A | Discharge status: Home / Self Care | Payer: No Typology Code available for payment source | Attending: Obstetrics and Gynecology | Admitting: Obstetrics and Gynecology

## 2020-01-09 ENCOUNTER — Other Ambulatory Visit: Payer: Self-pay

## 2020-01-09 ENCOUNTER — Encounter
Admission: RE | Disposition: A | Discharge status: Home / Self Care | Payer: Self-pay | Source: Home / Self Care | Attending: Obstetrics and Gynecology

## 2020-01-09 ENCOUNTER — Encounter: Payer: Self-pay | Admitting: Obstetrics and Gynecology

## 2020-01-09 ENCOUNTER — Ambulatory Visit: Payer: No Typology Code available for payment source | Admitting: Anesthesiology

## 2020-01-09 DIAGNOSIS — R9389 Abnormal findings on diagnostic imaging of other specified body structures: Secondary | ICD-10-CM

## 2020-01-09 DIAGNOSIS — Z30430 Encounter for insertion of intrauterine contraceptive device: Secondary | ICD-10-CM

## 2020-01-09 DIAGNOSIS — N856 Intrauterine synechiae: Secondary | ICD-10-CM | POA: Diagnosis not present

## 2020-01-09 DIAGNOSIS — N920 Excessive and frequent menstruation with regular cycle: Secondary | ICD-10-CM | POA: Diagnosis not present

## 2020-01-09 DIAGNOSIS — D649 Anemia, unspecified: Secondary | ICD-10-CM | POA: Diagnosis not present

## 2020-01-09 DIAGNOSIS — D500 Iron deficiency anemia secondary to blood loss (chronic): Secondary | ICD-10-CM

## 2020-01-09 DIAGNOSIS — N939 Abnormal uterine and vaginal bleeding, unspecified: Secondary | ICD-10-CM | POA: Diagnosis present

## 2020-01-09 HISTORY — PX: HYSTEROSCOPY WITH D & C: SHX1775

## 2020-01-09 HISTORY — PX: INTRAUTERINE DEVICE (IUD) INSERTION: SHX5877

## 2020-01-09 LAB — POCT PREGNANCY, URINE: Preg Test, Ur: NEGATIVE

## 2020-01-09 SURGERY — DILATATION AND CURETTAGE /HYSTEROSCOPY
Anesthesia: General | Site: Uterus

## 2020-01-09 MED ORDER — LACTATED RINGERS IV SOLN: INTRAVENOUS | Status: DC

## 2020-01-09 MED ORDER — MIDAZOLAM HCL 2 MG/2ML IJ SOLN
INTRAMUSCULAR | Status: AC
  Filled 2020-01-09: qty 2

## 2020-01-09 MED ORDER — SODIUM CHLORIDE 0.9 % IR SOLN
Status: DC | PRN
  Administered 2020-01-09: 3000 mL

## 2020-01-09 MED ORDER — 0.9 % SODIUM CHLORIDE (POUR BTL) OPTIME
TOPICAL | Status: DC | PRN
  Administered 2020-01-09: 15:00:00 500 mL

## 2020-01-09 MED ORDER — IBUPROFEN 600 MG PO TABS: 600.0000 mg | ORAL_TABLET | Freq: Four times a day (QID) | ORAL | 3 refills | Status: DC | PRN

## 2020-01-09 MED ORDER — FENTANYL CITRATE (PF) 100 MCG/2ML IJ SOLN
INTRAMUSCULAR | Status: DC | PRN
  Administered 2020-01-09 (×2): 25 ug via INTRAVENOUS

## 2020-01-09 MED ORDER — MIDAZOLAM HCL 2 MG/2ML IJ SOLN
INTRAMUSCULAR | Status: DC | PRN
  Administered 2020-01-09: 2 mg via INTRAVENOUS

## 2020-01-09 MED ORDER — PROPOFOL 10 MG/ML IV BOLUS
INTRAVENOUS | Status: DC | PRN
  Administered 2020-01-09: 200 mg via INTRAVENOUS

## 2020-01-09 MED ORDER — ONDANSETRON HCL 4 MG/2ML IJ SOLN
INTRAMUSCULAR | Status: DC | PRN
  Administered 2020-01-09 (×2): 4 mg via INTRAVENOUS

## 2020-01-09 MED ORDER — FENTANYL CITRATE (PF) 100 MCG/2ML IJ SOLN
INTRAMUSCULAR | Status: AC
  Filled 2020-01-09: qty 2

## 2020-01-09 MED ORDER — KETOROLAC TROMETHAMINE 30 MG/ML IJ SOLN
INTRAMUSCULAR | Status: DC | PRN
  Administered 2020-01-09: 30 mg via INTRAVENOUS

## 2020-01-09 MED ORDER — ONDANSETRON HCL 4 MG/2ML IJ SOLN: 4.0000 mg | Freq: Once | INTRAMUSCULAR | Status: DC | PRN

## 2020-01-09 MED ORDER — FENTANYL CITRATE (PF) 100 MCG/2ML IJ SOLN: 25.0000 ug | INTRAMUSCULAR | Status: DC | PRN

## 2020-01-09 MED ORDER — DEXAMETHASONE SODIUM PHOSPHATE 10 MG/ML IJ SOLN
INTRAMUSCULAR | Status: DC | PRN
  Administered 2020-01-09: 8 mg via INTRAVENOUS

## 2020-01-09 MED ORDER — HYDROCODONE-ACETAMINOPHEN 5-325 MG PO TABS: 1.0000 | ORAL_TABLET | Freq: Four times a day (QID) | ORAL | 0 refills | Status: DC | PRN

## 2020-01-09 MED ORDER — SEVOFLURANE IN SOLN
RESPIRATORY_TRACT | Status: AC
  Filled 2020-01-09: qty 250

## 2020-01-09 MED ORDER — EPHEDRINE SULFATE 50 MG/ML IJ SOLN
INTRAMUSCULAR | Status: DC | PRN
  Administered 2020-01-09: 10 mg via INTRAVENOUS
  Administered 2020-01-09: 5 mg via INTRAVENOUS

## 2020-01-09 MED ORDER — LIDOCAINE HCL (CARDIAC) PF 100 MG/5ML IV SOSY
PREFILLED_SYRINGE | INTRAVENOUS | Status: DC | PRN
  Administered 2020-01-09: 100 mg via INTRAVENOUS

## 2020-01-09 SURGICAL SUPPLY — 26 items
CATH ROBINSON RED A/P 16FR (CATHETERS) ×3 IMPLANT
CELL SAVER STANDBY W/O COLL (MISCELLANEOUS) ×3
COVER WAND RF STERILE (DRAPES) ×3 IMPLANT
DEVICE MYOSURE LITE (MISCELLANEOUS) IMPLANT
ELECT REM PT RETURN 9FT ADLT (ELECTROSURGICAL)
ELECTRODE REM PT RTRN 9FT ADLT (ELECTROSURGICAL) IMPLANT
GLOVE BIO SURGEON STRL SZ7 (GLOVE) ×3 IMPLANT
GLOVE INDICATOR 7.5 STRL GRN (GLOVE) ×3 IMPLANT
GOWN STRL REUS W/ TWL LRG LVL3 (GOWN DISPOSABLE) ×2 IMPLANT
GOWN STRL REUS W/TWL LRG LVL3 (GOWN DISPOSABLE) ×4
IV LACTATED RINGER IRRG 3000ML (IV SOLUTION)
IV LR IRRIG 3000ML ARTHROMATIC (IV SOLUTION) IMPLANT
IV NS IRRIG 3000ML ARTHROMATIC (IV SOLUTION) ×3 IMPLANT
KIT PROCEDURE FLUENT (KITS) ×3 IMPLANT
KIT TURNOVER CYSTO (KITS) ×3 IMPLANT
MIRENA INTRAUTERINE DEVICE ×3 IMPLANT
MYOSURE XL FIBROID (MISCELLANEOUS)
PACK DNC HYST (MISCELLANEOUS) ×3 IMPLANT
PAD OB MATERNITY 4.3X12.25 (PERSONAL CARE ITEMS) ×3 IMPLANT
PAD PREP 24X41 OB/GYN DISP (PERSONAL CARE ITEMS) ×3 IMPLANT
SEAL ROD LENS SCOPE MYOSURE (ABLATOR) ×3 IMPLANT
STANDBY W/O COLL CELL SAVER (MISCELLANEOUS) ×1 IMPLANT
SYSTEM TISS REMOVAL MYOSURE XL (MISCELLANEOUS) IMPLANT
TOWEL OR 17X26 4PK STRL BLUE (TOWEL DISPOSABLE) ×3 IMPLANT
TUBING CONNECTING 10 (TUBING) ×2 IMPLANT
TUBING CONNECTING 10' (TUBING) ×1

## 2020-01-09 NOTE — Anesthesia Preprocedure Evaluation (Addendum)
Anesthesia Evaluation  Patient identified by MRN, date of birth, ID band Patient awake    Reviewed: Allergy & Precautions, NPO status , Patient's Chart, lab work & pertinent test results  History of Anesthesia Complications Negative for: history of anesthetic complications  Airway Mallampati: II       Dental   Pulmonary asthma (as a child) , neg sleep apnea, neg COPD, Not current smoker,           Cardiovascular (-) hypertension(-) Past MI and (-) CHF (-) dysrhythmias (-) Valvular Problems/Murmurs     Neuro/Psych neg Seizures    GI/Hepatic Neg liver ROS, neg GERD  ,  Endo/Other  negative endocrine ROS  Renal/GU negative Renal ROS     Musculoskeletal   Abdominal   Peds  Hematology  (+) anemia ,   Anesthesia Other Findings   Reproductive/Obstetrics                            Anesthesia Physical Anesthesia Plan  ASA: II  Anesthesia Plan: General   Post-op Pain Management:    Induction: Intravenous  PONV Risk Score and Plan: 3 and Ondansetron, Dexamethasone and Midazolam  Airway Management Planned: LMA  Additional Equipment:   Intra-op Plan:   Post-operative Plan:   Informed Consent: I have reviewed the patients History and Physical, chart, labs and discussed the procedure including the risks, benefits and alternatives for the proposed anesthesia with the patient or authorized representative who has indicated his/her understanding and acceptance.       Plan Discussed with:   Anesthesia Plan Comments:         Anesthesia Quick Evaluation

## 2020-01-09 NOTE — Discharge Instructions (Signed)

## 2020-01-09 NOTE — Anesthesia Postprocedure Evaluation (Signed)
Anesthesia Post Note  Patient: Jeanette Townsend  Procedure(s) Performed: DILATATION AND CURETTAGE /HYSTEROSCOPY (N/A Uterus) INTRAUTERINE DEVICE (IUD) INSERTION (N/A Uterus)  Patient location during evaluation: PACU Anesthesia Type: General Level of consciousness: awake and alert Pain management: pain level controlled Vital Signs Assessment: post-procedure vital signs reviewed and stable Respiratory status: spontaneous breathing and respiratory function stable Cardiovascular status: stable Anesthetic complications: no     Last Vitals:  Vitals:   01/09/20 1615 01/09/20 1618  BP: 126/83   Pulse: 84 80  Resp: 16 (!) 25  Temp:    SpO2: 94% 94%    Last Pain:  Vitals:   01/09/20 1618  TempSrc:   PainSc: 0-No pain                 Peg Fifer K

## 2020-01-09 NOTE — Op Note (Signed)
Preoperative Diagnosis: 1) 49 y.o. with menorrhagia to anemia 2) Thickened endometrium   Postoperative Diagnosis: 1) 49 y.o. with menorrhagia to anemia 2) Thickened endometrium   Operation Performed: Hysteroscopy, dilation and curettage, Mirena IUD  Indication: Menorrhagia to anemia, thickened endometrium, history of prior submucosal myoma resection  Anesthesia: .General  Primary Surgeon: Malachy Mood, MD  Assistant: none  Preoperative Antibiotics: none  Estimated Blood Loss: 10 mL  IV Fluids: 656mL  Urine Output:: ~21mL straight cath  Drains or Tubes: none  Implants: none  Specimens Removed: endometrial curettings  Complications: none  Intraoperative Findings:  Normal cervix, tubal ostia.  Thickened endometrium with synechia at the uterine fundus at site of prior myomectomy.     Patient Condition: stable  Procedure in Detail:  Patient was taken to the operating room were she was administered general endotracheal anesthesia.  She was positioned in the dorsal lithotomy position utilizing Allen stirups, prepped and draped in the usual sterile fashion.  Uterus was noted to be non-enlarged in size, anteverted.   Prior to proceeding with the case a time out was performed.  Attention was turned to the patient's pelvis.  A red rubber catheter was used to empty the patient's bladder.  An operative speculum was placed to allow visualization of the cervix.  The anterior lip of the cervix was grasped with a single tooth tenaculum and the cervix was sequentially dilated using pratt dilators.  Sounding length was noted to be 9.5cm. The hysteroscope was then advanced into the uterine cavity noting the above findings.  Sharp curettage was performed and the resulting specimen collected and sent to pathology.              IUD placed per manufacturer's recommendations.  Strings trimmed to 3 cm. The single tooth tenaculum was removed from the cervix.  The tenaculum sites and cervix were  noted to be  Hemostatic before removing the operative speculum.  Sponge needle and instrument counts were corrects times two.  The patient tolerated the procedure well and was taken to the recovery room in stable condition.

## 2020-01-09 NOTE — H&P (Signed)
Obstetrics & Gynecology Surgery H&P    Chief Complaint: Scheduled Surgery   History of Present Illness: Patient is a 49 y.o. G0P0000 presenting for scheduled hysteroscopy, D&C, for the treatment or further evaluation of AUB, thickened endometrium in setting of prior submucosal leiyoma .   Prior Treatments prior to proceeding with surgery include: none  Preoperative Pap: 12/13/2019 NIL HPV negative Preoperative Endometrial biopsy: N/A Preoperative Ultrasound: 01/03/2020 thickened endometrial lining, unable to rule out polyp or focal lesion   Review of Systems:10 point review of systems  Past Medical History:  Past Medical History:  Diagnosis Date  . Allergy   . Anemia   . Asthma    childhood  . Excessive vaginal bleeding   . Fibroids     Past Surgical History:  Past Surgical History:  Procedure Laterality Date  . DILATATION & CURETTAGE/HYSTEROSCOPY WITH MYOSURE N/A 05/26/2016   Procedure: DILATATION & CURETTAGE/HYSTEROSCOPY WITH MYOSURE RESECTION OF SUBMUCOSAL FIBROID;  Surgeon: Malachy Mood, MD;  Location: [facility] ORS;  Service: Gynecology;  Laterality: N/A;  . DILATION AND CURETTAGE OF UTERUS  08/2011    Family History:  Family History  Problem Relation Age of Onset  . Hypertension Mother   . Diverticulitis Mother   . Breast cancer Neg Hx     Social History:  Social History   Socioeconomic History  . Marital status: Married    Spouse name: Not on file  . Number of children: Not on file  . Years of education: Not on file  . Highest education level: Not on file  Occupational History  . Not on file  Tobacco Use  . Smoking status: Never Smoker  . Smokeless tobacco: Never Used  Substance and Sexual Activity  . Alcohol use: No  . Drug use: No  . Sexual activity: Yes    Birth control/protection: Condom  Other Topics Concern  . Not on file  Social History Narrative  . Not on file   Social Determinants of Health   Financial Resource Strain:   .  Difficulty of Paying Living Expenses: Not on file  Food Insecurity:   . Worried About Charity fundraiser in the Last Year: Not on file  . Ran Out of Food in the Last Year: Not on file  Transportation Needs:   . Lack of Transportation (Medical): Not on file  . Lack of Transportation (Non-Medical): Not on file  Physical Activity:   . Days of Exercise per Week: Not on file  . Minutes of Exercise per Session: Not on file  Stress:   . Feeling of Stress : Not on file  Social Connections:   . Frequency of Communication with Friends and Family: Not on file  . Frequency of Social Gatherings with Friends and Family: Not on file  . Attends Religious Services: Not on file  . Active Member of Clubs or Organizations: Not on file  . Attends Archivist Meetings: Not on file  . Marital Status: Not on file  Intimate Partner Violence:   . Fear of Current or Ex-Partner: Not on file  . Emotionally Abused: Not on file  . Physically Abused: Not on file  . Sexually Abused: Not on file    Allergies:  No Known Allergies  Medications: Prior to Admission medications   Medication Sig Start Date End Date Taking? Authorizing Provider  Ascorbic Acid (VITAMIN C PO) Take 1,000 mg by mouth daily.    Yes [provider]  cetirizine (ZYRTEC) 10 MG tablet Take 10  mg by mouth daily as needed for allergies.   Yes [provider]  Cholecalciferol 25 MCG (1000 UT) tablet Take 1,000 Units by mouth daily.    Yes [provider]  ferrous sulfate (FERROUSUL) 325 (65 FE) MG tablet Take 1 tablet (325 mg total) by mouth 2 (two) times daily. 12/23/19  Yes Malachy Mood, MD  tetrahydrozoline-zinc (VISINE-AC) 0.05-0.25 % ophthalmic solution Place 2 drops into both eyes every morning.   Yes [provider]    Physical Exam Vitals: Last menstrual period 12/18/2019. General: NAD, well nourished, appears stated age 18: normocephalic, anicteric Pulmonary: No increased work of  breathing, CTAB Cardiovascular: RRR, distal pulses 2+ Abdomen: soft, non-tender Genitourinary: deferred Extremities: no edema, erythema, or tenderness Neurologic: Grossly intact Psychiatric: mood appropriate, affect full  Imaging US Transvaginal Non-OB  Result Date: 01/03/2020 Patient Name: Jeanette Townsend DOB: [birth year] MRN: [id number] ULTRASOUND REPORT Location: [facility] Date of Service: 01/03/2020 Indications:Abnormal Uterine Bleeding Findings: The uterus is anteverted and measures 9.4 x 5.5 x 4.7 cm. Echo texture is heterogenous with evidence of focal masses. Within the uterus are multiple suspected fibroids measuring: Fibroid 1:12.4 x 10.2 x 18.5 mm subserosal left There are questionably ill-defined fibroids in the uterus up to 2.3 cm. The Endometrium measures 18.9 mm. There is a fluid collection in the posterior portion of the cervix measuring 36.5 x 9.6 x 20.9 mm. This may be nabothian cysts vs. other Right Ovary measures 4.4 x 3.6 x 3.7 cm. It is not normal in appearance. There is a simple cyst in the right ovary measuring 38.8 x 32.1 x 31.3 mm. Left Ovary measures 2.9 x 2.5 x 1.3 cm. It is normal in appearance. Survey of the adnexa demonstrates no adnexal masses. There is no free fluid in the cul de sac. Impression: 1. The endometrium is thick. There are no obvious endometrial polyps or submucosal fibroids. 2. The myometrium is heterogenous with possible ill-defined fibroids. 3. There is a fluid collection in the posterior cervix. This may represent a nabothian cyst. 4. Normal appearing left ovary. 5. There is a 3.9 cm simple cyst in the right ovary. Recommendations: 1.Clinical correlation with the patient's History and Physical Exam. Gweneth Dimitri, RT Images reviewed.  The uterine lining is thickened, there are no focal endometrial abnormalities.  There is at least one subserosal fibroid measuring just under 2 cm.  Small nabothian cyst.  Simple cyst right ovary. Malachy Mood, MD, [facility], [facility] 01/03/2020, 9:11 AM   MM 3D SCREEN BREAST BILATERAL  Result Date: 12/26/2019 CLINICAL DATA:  Screening. EXAM: DIGITAL SCREENING BILATERAL MAMMOGRAM WITH TOMO AND CAD COMPARISON:  Previous exam(s). ACR Breast Density Category c: The breast tissue is heterogeneously dense, which may obscure small masses. FINDINGS: There are no findings suspicious for malignancy. Images were processed with CAD. IMPRESSION: No mammographic evidence of malignancy. A result letter of this screening mammogram will be mailed directly to the patient. RECOMMENDATION: Screening mammogram in one year. (Code:SM-B-01Y) BI-RADS CATEGORY  1: Negative. Electronically Signed   By: Lovey Newcomer M.D.   On: 12/26/2019 09:25    Assessment: 49 y.o. G0P0000 presenting for scheduled hysteroscopy, D&C, and Mirena IUD  Plan: 1) I have discussed with the patient the indications for the procedure. Included in the discussion were the options of therapy, as wall as their individual risks, benefits, and complications. Ample time was given to answer all questions.   In office pipelle biopsy generally provides comparable results to [place], however  this sampling modality may miss focal lesions if these were previously documented on ultrasound.  It is because of the potential to miss focal lesions that hysteroscopy D&C is also warranted in patient with continued postmenopausal bleeding that is not self limited regardless of prior in office biopsy results or ultrasound findings.  She understands that the risk of continued observation include worsening bleeding or worsening of any underlying pathology.  The choices include: 1. Doing nothing but following her symptoms 2. Attempts at hormonal manipulation with either BCP or Depo-Provera for premenopausal patients with no concern for focal lesion or endometrial pathology 3. D&C/hysteroscopy. 4. Endometrial ablation via Novasure or other techniques for premenopausal patients  with no concern for focal lesion or endometrial pathology  5. As final resort, hysterectomy. After consideration of her history and findings, mutual decision has been made to proceed with D+C/hysteroscopy. While the incidence is low, the risks from this surgery include, but are not limited to, the risks of anesthesia, hemorrhage, infection, perforation, and injury to adjacent structures including bowel, bladder and blood vessels.    2) Routine postoperative instructions were reviewed with the patient and her family in detail today including the expected length of recovery and likely postoperative course.  The patient concurred with the proposed plan, giving informed written consent for the surgery today.  Patient instructed on the importance of being NPO after midnight prior to her procedure.  If warranted preoperative prophylactic antibiotics and SCDs ordered on call to the OR to meet SCIP guidelines and adhere to recommendation laid forth in [place] Number 104 May 2009  "Antibiotic Prophylaxis for Gynecologic Procedures".     Malachy Mood, MD, [facility], [facility] 01/09/2020, 2:24 PM

## 2020-01-09 NOTE — Anesthesia Procedure Notes (Signed)
Procedure Name: LMA Insertion Date/Time: 01/09/2020 3:08 PM Performed by: Hedda Slade, CRNA Pre-anesthesia Checklist: Patient identified, Patient being monitored, Timeout performed, Emergency Drugs available and Suction available Patient Re-evaluated:Patient Re-evaluated prior to induction Oxygen Delivery Method: Circle system utilized Preoxygenation: Pre-oxygenation with 100% oxygen Induction Type: IV induction Ventilation: Mask ventilation without difficulty LMA: LMA inserted LMA Size: 4.0 Tube type: Oral Number of attempts: 1 Placement Confirmation: positive ETCO2 and breath sounds checked- equal and bilateral Tube secured with: Tape Dental Injury: Teeth and Oropharynx as per pre-operative assessment

## 2020-01-09 NOTE — Transfer of Care (Signed)
Immediate Anesthesia Transfer of Care Note  Patient: Jeanette Townsend  Procedure(s) Performed: DILATATION AND CURETTAGE /HYSTEROSCOPY (N/A Uterus) INTRAUTERINE DEVICE (IUD) INSERTION (N/A Uterus)  Patient Location: PACU  Anesthesia Type:General  Level of Consciousness: sedated  Airway & Oxygen Therapy: Patient Spontanous Breathing and Patient connected to face mask oxygen  Post-op Assessment: Report given to RN and Post -op Vital signs reviewed and stable  Post vital signs: Reviewed and stable  Last Vitals:  Vitals Value Taken Time  BP 118/65 01/09/20 1543  Temp    Pulse 75 01/09/20 1548  Resp 19 01/09/20 1548  SpO2 99 % 01/09/20 1548  Vitals shown include unvalidated device data.  Last Pain:  Vitals:   01/09/20 1433  TempSrc: Oral  PainSc: 0-No pain         Complications: No apparent anesthesia complications

## 2020-01-14 LAB — SURGICAL PATHOLOGY

## 2020-01-15 ENCOUNTER — Encounter: Payer: Self-pay | Admitting: Obstetrics and Gynecology

## 2020-01-15 ENCOUNTER — Other Ambulatory Visit: Payer: Self-pay

## 2020-01-15 ENCOUNTER — Ambulatory Visit (INDEPENDENT_AMBULATORY_CARE_PROVIDER_SITE_OTHER): Payer: No Typology Code available for payment source | Admitting: Obstetrics and Gynecology

## 2020-01-15 VITALS — BP 139/86 | Wt 179.0 lb

## 2020-01-15 DIAGNOSIS — Z4889 Encounter for other specified surgical aftercare: Secondary | ICD-10-CM

## 2020-01-15 NOTE — Progress Notes (Signed)
      Postoperative Follow-up Patient presents post op from hysteroscopy, D&C, MIrena IUD insertion 1weeks ago for endometrial polyp, AUB.  Subjective: Patient reports marked improvement in her preop symptoms. Eating a regular diet without difficulty. The patient is not having any pain.  Activity: normal activities of daily living.  Objective: Blood pressure 139/86, weight 179 lb (81.2 kg), last menstrual period 12/18/2019.  General: NAD Pulmonary: no increased work of breathing Extremities: no edema Neurologic: normal gait    Admission on 01/09/2020, Discharged on 01/09/2020  Component Date Value Ref Range Status  . Preg Test, Ur 01/09/2020 NEGATIVE  NEGATIVE Final   Comment:        THE SENSITIVITY OF THIS METHODOLOGY IS >24 mIU/mL   . SURGICAL PATHOLOGY 01/09/2020    Final-Edited                   Value:SURGICAL PATHOLOGY CASE: [id number] PATIENT: [facility] Surgical Pathology Report     Specimen Submitted: A. Endometrial curettings  Clinical History: AUB N93.9, anemia D64.9, thickened endometrium R93.89      DIAGNOSIS: A. ENDOMETRIUM; BIOPSY: - FEATURES SUGGESTIVE OF BENIGN ENDOMETRIAL POLYP. 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 - NEGATIVE FOR ATYPIA / EIN AND MALIGNANCY.   GROSS DESCRIPTION: A. Labeled: Endometrial curettings Received: Formalin Tissue fragment(s): Multiple Size: Aggregate, 5.7 x 2.0 x 0.4 cm Description: Received are irregular fragments of tan-brown soft tissue admixed with hemorrhagic material.  Filtered into mesh bags. Entirely submitted in cassettes 1-3.     Final Diagnosis performed by Betsy Pries, MD.   Electronically signed 01/14/2020 10:04:22AM The electronic signature indicates that the named Attending Pathologist has evaluated the specimen Technical component performed at [facility], [address], [place], [place] [address] Lab: [phone] Dir: Rush Farmer, MD, MMM  Professional component performed at [facility], [place], [place], [place] [address] Lab: [phone] Dir: Dellia Nims. Reuel Derby, MD     Assessment: 49 y.o. s/p hysteroscopy, D&C, Mirena IUD insertion stable  Plan: Patient has done well after surgery with no apparent complications.  I have discussed the post-operative course to date, and the expected progress moving forward.  The patient understands what complications to be concerned about.  I will see the patient in routine follow up, or sooner if needed.    Activity plan: No restriction.   Malachy Mood, MD, [facility], [facility] 01/15/2020, 2:51 PM

## 2020-02-27 ENCOUNTER — Encounter: Payer: Self-pay | Admitting: Obstetrics and Gynecology

## 2020-02-27 ENCOUNTER — Ambulatory Visit (INDEPENDENT_AMBULATORY_CARE_PROVIDER_SITE_OTHER): Payer: No Typology Code available for payment source | Admitting: Obstetrics and Gynecology

## 2020-02-27 ENCOUNTER — Other Ambulatory Visit: Payer: Self-pay

## 2020-02-27 VITALS — BP 156/95 | HR 79 | Wt 182.0 lb

## 2020-02-27 DIAGNOSIS — Z4889 Encounter for other specified surgical aftercare: Secondary | ICD-10-CM

## 2020-02-27 DIAGNOSIS — Z30431 Encounter for routine checking of intrauterine contraceptive device: Secondary | ICD-10-CM | POA: Diagnosis not present

## 2020-02-27 NOTE — Progress Notes (Signed)
      Postoperative Follow-up Patient presents post op from hysteroscopy, D&C, Mirena IUD placement 6weeks ago for abnormal uterine bleeding and endometrial polyp.  Subjective: Patient reports marked improvement in her preop symptoms. Eating a regular diet without difficulty. Pain is controlled without any medications.  Activity: normal activities of daily living.  Objective: Blood pressure (!) 156/95, pulse 79, weight 182 lb (82.6 kg), last menstrual period 02/05/2020.  General: NAD Pulmonary: no increased work of breathing Abdomen: soft, non-tender, non-distended, incision(s) D/C/I GU: normal external female genitalia normal cervix, no CMT, uterus normal in shape and contour, no adnexal tenderness or masses, IUD strings visualized 2cm Extremities: no edema Neurologic: normal gait    Admission on 01/09/2020, Discharged on 01/09/2020  Component Date Value Ref Range Status  . Preg Test, Ur 01/09/2020 NEGATIVE  NEGATIVE Final   Comment:        THE SENSITIVITY OF THIS METHODOLOGY IS >24 mIU/mL   . SURGICAL PATHOLOGY 01/09/2020    Final-Edited                   Value:SURGICAL PATHOLOGY CASE: [id number] PATIENT: [facility] Surgical Pathology Report     Specimen Submitted: A. Endometrial curettings  Clinical History: AUB N93.9, anemia D64.9, thickened endometrium R93.89      DIAGNOSIS: A. ENDOMETRIUM; BIOPSY: - FEATURES SUGGESTIVE OF BENIGN ENDOMETRIAL POLYP. 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 - NEGATIVE FOR ATYPIA / EIN AND MALIGNANCY.   GROSS DESCRIPTION: A. Labeled: Endometrial curettings Received: Formalin Tissue fragment(s): Multiple Size: Aggregate, 5.7 x 2.0 x 0.4 cm Description: Received are irregular fragments of tan-brown soft tissue admixed with hemorrhagic material.  Filtered into mesh bags. Entirely submitted in cassettes 1-3.     Final Diagnosis performed by Betsy Pries, MD.   Electronically  signed 01/14/2020 10:04:22AM The electronic signature indicates that the named Attending Pathologist has evaluated the specimen Technical component performed at [facility], [address], [place], [place] [address] Lab: [phone] Dir: Rush Farmer, MD, MMM  Professional component performed at [facility], [facility], [place], [place], [place] [address] Lab: [phone] Dir: Dellia Nims. Reuel Derby, MD     Assessment: 49 y.o. s/p hysteroscopy, D&C, Mirena IUD stable  Plan: Patient has done well after surgery with no apparent complications.  I have discussed the post-operative course to date, and the expected progress moving forward.  The patient understands what complications to be concerned about.  I will see the patient in routine follow up, or sooner if needed.    Activity plan: No restriction.   Malachy Mood, MD, [facility], [facility] 02/27/2020, 8:14 AM

## 2021-03-18 ENCOUNTER — Encounter: Payer: Self-pay | Admitting: Obstetrics and Gynecology

## 2021-03-18 ENCOUNTER — Ambulatory Visit (INDEPENDENT_AMBULATORY_CARE_PROVIDER_SITE_OTHER): Payer: PRIVATE HEALTH INSURANCE | Admitting: Obstetrics and Gynecology

## 2021-03-18 ENCOUNTER — Other Ambulatory Visit: Payer: Self-pay

## 2021-03-18 VITALS — BP 151/108 | HR 85 | Ht 64.0 in | Wt 180.4 lb

## 2021-03-18 DIAGNOSIS — Z1211 Encounter for screening for malignant neoplasm of colon: Secondary | ICD-10-CM

## 2021-03-18 DIAGNOSIS — Z13220 Encounter for screening for lipoid disorders: Secondary | ICD-10-CM

## 2021-03-18 DIAGNOSIS — R030 Elevated blood-pressure reading, without diagnosis of hypertension: Secondary | ICD-10-CM

## 2021-03-18 DIAGNOSIS — Z1231 Encounter for screening mammogram for malignant neoplasm of breast: Secondary | ICD-10-CM

## 2021-03-18 DIAGNOSIS — Z6830 Body mass index (BMI) 30.0-30.9, adult: Secondary | ICD-10-CM

## 2021-03-18 DIAGNOSIS — Z01419 Encounter for gynecological examination (general) (routine) without abnormal findings: Secondary | ICD-10-CM | POA: Diagnosis not present

## 2021-03-18 DIAGNOSIS — Z131 Encounter for screening for diabetes mellitus: Secondary | ICD-10-CM

## 2021-03-18 DIAGNOSIS — Z1239 Encounter for other screening for malignant neoplasm of breast: Secondary | ICD-10-CM

## 2021-03-18 DIAGNOSIS — R5383 Other fatigue: Secondary | ICD-10-CM

## 2021-03-18 DIAGNOSIS — E669 Obesity, unspecified: Secondary | ICD-10-CM

## 2021-03-18 DIAGNOSIS — Z1329 Encounter for screening for other suspected endocrine disorder: Secondary | ICD-10-CM

## 2021-03-18 NOTE — Progress Notes (Signed)
Gynecology Annual Exam  PCP: Malachy Mood, MD  Chief Complaint: No chief complaint on file.   History of Present Illness: Patient is a 50 y.o. G0P0000 presents for annual exam. The patient has no complaints today.   LMP: No LMP recorded. (Menstrual status: IUD). Irregular breakthrough bleeding on IUD, self limited, no heavy.  The patient is sexually active. She currently uses IUD for contraception. She denies dyspareunia.  The patient does perform self breast exams.  There is no notable family history of breast or ovarian cancer in her family.  The patient wears seatbelts: yes.   The patient has regular exercise: not asked.    The patient denies current symptoms of depression.    Review of Systems: Review of Systems  Constitutional: Negative for chills and fever.  HENT: Negative for congestion.   Respiratory: Negative for cough and shortness of breath.   Cardiovascular: Negative for chest pain and palpitations.  Gastrointestinal: Negative for abdominal pain, constipation, diarrhea, heartburn, nausea and vomiting.  Genitourinary: Negative for dysuria, frequency and urgency.  Skin: Negative for itching and rash.  Neurological: Negative for dizziness and headaches.  Endo/Heme/Allergies: Negative for polydipsia.  Psychiatric/Behavioral: Negative for depression.    Past Medical History:  Patient Active Problem List   Diagnosis Date Noted   Iron deficiency anemia due to chronic blood loss 04/28/2016   Fibroid 04/21/2016    Overview:  hysteroscopy    ALLERGIC RHINITIS 08/14/2007    Qualifier: Diagnosis of  By: Diona Browner MD, Amy      ASTHMA 08/14/2007    Qualifier: Diagnosis of  By: Diona Browner MD, Amy       Past Surgical History:  Past Surgical History:  Procedure Laterality Date   DILATATION & CURETTAGE/HYSTEROSCOPY WITH MYOSURE N/A 05/26/2016   Procedure: DILATATION & CURETTAGE/HYSTEROSCOPY WITH MYOSURE RESECTION OF SUBMUCOSAL FIBROID;  Surgeon: Malachy Mood, MD;  Location: [facility];  Service: Gynecology;  Laterality: N/A;   DILATION AND CURETTAGE OF UTERUS  08/2011   HYSTEROSCOPY WITH D & C N/A 01/09/2020   Procedure: DILATATION AND CURETTAGE /HYSTEROSCOPY;  Surgeon: Malachy Mood, MD;  Location: [facility];  Service: Gynecology;  Laterality: N/A;   INTRAUTERINE DEVICE (IUD) INSERTION N/A 01/09/2020   Procedure: INTRAUTERINE DEVICE (IUD) INSERTION;  Surgeon: Malachy Mood, MD;  Location: [facility] ORS;  Service: Gynecology;  Laterality: N/A;    Gynecologic History:  No LMP recorded. (Menstrual status: IUD). Contraception: 01/09/2020 Mirean IUD Last Pap: Results were: 12/13/2019 NIL and HR HPV negative  Endometrial Biopsy (D&C specimen): 01/09/2020 benign endometrial polyp, proliferative endometrium Last mammogram: 12/26/2019 Results were: BI-RAD I  Obstetric History: G0P0000  Family History:  Family History  Problem Relation Age of Onset   Hypertension Mother    Diverticulitis Mother    Breast cancer Neg Hx     Social History:  Social History   Socioeconomic History   Marital status: Married    Spouse name: Not on file   Number of children: Not on file   Years of education: Not on file   Highest education level: Not on file  Occupational History   Not on file  Tobacco Use   Smoking status: Never Smoker   Smokeless tobacco: Never Used  Substance and Sexual Activity   Alcohol use: No   Drug use: No   Sexual activity: Yes    Birth control/protection: I.U.D.  Other Topics Concern   Not on file  Social History Narrative   Not on file   Social Determinants of Health  Financial Resource Strain: Not on file  Food Insecurity: Not on file  Transportation Needs: Not on file  Physical Activity: Not on file  Stress: Not on file  Social Connections: Not on file  Intimate Partner Violence: Not on file    Allergies:  No Known Allergies  Medications: Prior to Admission medications   Medication Sig  Start Date End Date Taking? Authorizing Provider  Ascorbic Acid (VITAMIN C PO) Take 1,000 mg by mouth daily.     [provider]  cetirizine (ZYRTEC) 10 MG tablet Take 10 mg by mouth daily as needed for allergies.    [provider]  Cholecalciferol 25 MCG (1000 UT) tablet Take 1,000 Units by mouth daily.     [provider]  ferrous sulfate (FERROUSUL) 325 (65 FE) MG tablet Take 1 tablet (325 mg total) by mouth 2 (two) times daily. 12/23/19   Malachy Mood, MD  ibuprofen (ADVIL) 600 MG tablet Take 1 tablet (600 mg total) by mouth every 6 (six) hours as needed. 01/09/20   Malachy Mood, MD  tetrahydrozoline-zinc (VISINE-AC) 0.05-0.25 % ophthalmic solution Place 2 drops into both eyes every morning.    [provider]    Physical Exam Vitals: Blood pressure (!) 151/108, pulse 85, height 5\' 4"  (1.626 m), weight 180 lb 6 oz (81.8 kg). Body mass index is 30.96 kg/m.  General: NAD HEENT: normocephalic, anicteric Thyroid: no enlargement, no palpable nodules Pulmonary: No increased work of breathing, CTAB Cardiovascular: RRR, distal pulses 2+ Breast: Breast symmetrical, no tenderness, no palpable nodules or masses, no skin or nipple retraction present, no nipple discharge.  No axillary or supraclavicular lymphadenopathy. Abdomen: NABS, soft, non-tender, non-distended.  Umbilicus without lesions.  No hepatomegaly, splenomegaly or masses palpable. No evidence of hernia  Genitourinary:  External: Normal external female genitalia.  Normal urethral meatus, normal Bartholin's and Skene's glands.  Small pedunculated skin tag left mons.  Vagina: Normal vaginal mucosa, no evidence of prolapse.    Cervix: Grossly normal in appearance, mild bleeding, IUD strings visualized  Uterus: Non-enlarged, mobile, normal contour.  No CMT  Adnexa: ovaries non-enlarged, no adnexal masses  Rectal: deferred  Lymphatic: no evidence of inguinal lymphadenopathy Extremities: no  edema, erythema, or tenderness Neurologic: Grossly intact Psychiatric: mood appropriate, affect full  Female chaperone present for pelvic and breast  portions of the physical exam   There is no immunization history on file for this patient.   Assessment: 50 y.o. G0P0000 routine annual exam  Plan: Problem List Items Addressed This Visit   None     1) Mammogram - recommend yearly screening mammogram.  Mammogram Was ordered today   2) STI screening  was notoffered and therefore not obtained  3) ASCCP guidelines and rational discussed.  Patient opts for every 3 years screening interval  4) Contraception - the patient is currently using  IUD.  She is happy with her current form of contraception and plans to continue  5) Colonoscopy -- Screening recommended starting at age 66 for average risk individuals, age 70 for individuals deemed at increased risk (including African Americans) and recommended to continue until age 55.  For patient age 3-85 individualized approach is recommended.  Gold standard screening is via colonoscopy, Cologuard screening is an acceptable alternative for patient unwilling or unable to undergo colonoscopy.  "Colorectal cancer screening for average?risk adults: 2018 guideline update from the American Cancer Society"CA: A Cancer Journal for Clinicians: May 24, 2017  - colonoscopy referral sent  6) Routine healthcare maintenance including  cholesterol, diabetes screening discussed Ordered today  7) BP elevation - will recheck in 4-6 weeks  8) Skin tag - discussed removal at time of BP follow up  9) No follow-ups on file.   Malachy Mood, MD, [facility], [facility] 03/18/2021, 8:23 AM

## 2021-03-18 NOTE — Patient Instructions (Signed)
[facility] [address] [place] [address]  [facility] [place]  [address] [place] [address]  Phone: [phone]  

## 2021-03-19 LAB — CMP14+LP+TP+TSH+CBC/PLT
ALT: 18 IU/L (ref 0–32)
AST: 21 IU/L (ref 0–40)
Albumin/Globulin Ratio: 1.7 (ref 1.2–2.2)
Albumin: 4.5 g/dL (ref 3.8–4.8)
Alkaline Phosphatase: 82 IU/L (ref 44–121)
BUN/Creatinine Ratio: 14 (ref 9–23)
BUN: 9 mg/dL (ref 6–24)
Bilirubin Total: 0.6 mg/dL (ref 0.0–1.2)
CO2: 23 mmol/L (ref 20–29)
Calcium: 9.7 mg/dL (ref 8.7–10.2)
Chloride: 99 mmol/L (ref 96–106)
Cholesterol, Total: 195 mg/dL (ref 100–199)
Creatinine, Ser: 0.64 mg/dL (ref 0.57–1.00)
Free Thyroxine Index: 1.8 (ref 1.2–4.9)
Globulin, Total: 2.7 g/dL (ref 1.5–4.5)
Glucose: 97 mg/dL (ref 65–99)
HDL: 48 mg/dL (ref >39)
Hematocrit: 44.3 % (ref 34.0–46.6)
Hemoglobin: 14.9 g/dL (ref 11.1–15.9)
LDL Chol Calc (NIH): 122 mg/dL — ABNORMAL HIGH (ref 0–99)
LDL/HDL Ratio: 2.5 ratio (ref 0.0–3.2)
MCH: 29.7 pg (ref 26.6–33.0)
MCHC: 33.6 g/dL (ref 31.5–35.7)
MCV: 88 fL (ref 79–97)
Platelets: 343 10*3/uL (ref 150–450)
Potassium: 4.4 mmol/L (ref 3.5–5.2)
RBC: 5.02 x10E6/uL (ref 3.77–5.28)
RDW: 12.6 % (ref 11.7–15.4)
Sodium: 137 mmol/L (ref 134–144)
T3 Uptake Ratio: 22 % — ABNORMAL LOW (ref 24–39)
T4, Total: 8.2 ug/dL (ref 4.5–12.0)
TSH: 1.7 u[IU]/mL (ref 0.450–4.500)
Total Protein: 7.2 g/dL (ref 6.0–8.5)
Triglycerides: 143 mg/dL (ref 0–149)
VLDL Cholesterol Cal: 25 mg/dL (ref 5–40)
WBC: 8.9 10*3/uL (ref 3.4–10.8)
eGFR: 110 mL/min/{1.73_m2} (ref >59)

## 2021-04-14 ENCOUNTER — Encounter: Payer: Self-pay | Admitting: *Deleted

## 2021-04-22 ENCOUNTER — Other Ambulatory Visit: Payer: Self-pay

## 2021-04-22 ENCOUNTER — Other Ambulatory Visit (HOSPITAL_COMMUNITY)
Admission: RE | Admit: 2021-04-22 | Discharge: 2021-04-22 | Disposition: A | Discharge status: Home / Self Care | Payer: No Typology Code available for payment source | Source: Ambulatory Visit | Attending: Obstetrics and Gynecology | Admitting: Obstetrics and Gynecology

## 2021-04-22 ENCOUNTER — Encounter: Payer: Self-pay | Admitting: Gastroenterology

## 2021-04-22 ENCOUNTER — Encounter: Payer: Self-pay | Admitting: Obstetrics and Gynecology

## 2021-04-22 ENCOUNTER — Telehealth (INDEPENDENT_AMBULATORY_CARE_PROVIDER_SITE_OTHER): Payer: Self-pay | Admitting: Gastroenterology

## 2021-04-22 ENCOUNTER — Ambulatory Visit (INDEPENDENT_AMBULATORY_CARE_PROVIDER_SITE_OTHER): Payer: PRIVATE HEALTH INSURANCE | Admitting: Obstetrics and Gynecology

## 2021-04-22 VITALS — BP 144/86 | HR 86 | Ht 64.0 in | Wt 182.0 lb

## 2021-04-22 DIAGNOSIS — N9089 Other specified noninflammatory disorders of vulva and perineum: Secondary | ICD-10-CM | POA: Insufficient documentation

## 2021-04-22 DIAGNOSIS — Z1211 Encounter for screening for malignant neoplasm of colon: Secondary | ICD-10-CM

## 2021-04-22 MED ORDER — NA SULFATE-K SULFATE-MG SULF 17.5-3.13-1.6 GM/177ML PO SOLN: 1.0000 | Freq: Once | ORAL | 0 refills | Status: AC

## 2021-04-22 MED ORDER — LIDOCAINE-PRILOCAINE 2.5-2.5 % EX CREA: 1.0000 "application " | TOPICAL_CREAM | CUTANEOUS | 0 refills | Status: DC | PRN

## 2021-04-22 NOTE — Progress Notes (Signed)
Gastroenterology Pre-Procedure Review  Request Date: Thursday 05/06/21 Requesting Physician: Dr. Allen Norris  PATIENT REVIEW QUESTIONS: The patient responded to the following health history questions as indicated:    1. Are you having any GI issues? no 2. Do you have a personal history of Polyps? no 3. Do you have a family history of Colon Cancer or Polyps? no 4. Diabetes Mellitus? no 5. Joint replacements in the past 12 months?no 6. Major health problems in the past 3 months?Not in the past 3 months however  in January 2021 Fibroids were removed 7. Any artificial heart valves, MVP, or defibrillator?no    MEDICATIONS & ALLERGIES:    Patient reports the following regarding taking any anticoagulation/antiplatelet therapy:   Plavix, Coumadin, Eliquis, Xarelto, Lovenox, Pradaxa, Brilinta, or Effient? no Aspirin? no  Patient confirms/reports the following medications:  Current Outpatient Medications  Medication Sig Dispense Refill  . Ascorbic Acid (VITAMIN C PO) Take 1,000 mg by mouth daily.     . cetirizine (ZYRTEC) 10 MG tablet Take 10 mg by mouth daily as needed for allergies.    . Cholecalciferol 25 MCG (1000 UT) tablet Take 1,000 Units by mouth daily.     Marland Kitchen lidocaine-prilocaine (EMLA) cream Apply 1 application topically as needed. 30 g 0  . pimecrolimus (ELIDEL) 1 % cream Apply topically daily.    . tranexamic acid (LYSTEDA) 650 MG TABS tablet Take 1,300 mg by mouth 3 (three) times daily.     No current facility-administered medications for this visit.    Patient confirms/reports the following allergies:  No Known Allergies  No orders of the defined types were placed in this encounter.   AUTHORIZATION INFORMATION Primary Insurance: 1D#: Group #:  Secondary Insurance: 1D#: Group #:  SCHEDULE INFORMATION: Date: 05/06/21 Time: Location:[facility]

## 2021-04-22 NOTE — Progress Notes (Signed)
   VULVAR BIOPSY NOTE The indications for vulvar biopsy (rule out neoplasia, establish lichen sclerosus diagnosis) were reviewed.   Risks of the biopsy including pain, bleeding, infection, inadequate specimen, scarring and need for additional procedures  were discussed. The patient stated understanding and agreed to undergo procedure today. Consent was signed,  time out performed.  The patient's vulva was prepped with Betadine. 1% lidocaine was injected into the base of two skin tags (left mons, and left labia minora). The skin tags was elevated with a forceps, shaved off using an 11 blade scalpel..  Small bleeding was noted and hemostasis was achieved using silver nitrate sticks.  The patient tolerated the procedure well. Post-procedure instructions  (pelvic rest for one week) were given to the patient. The patient is to call with heavy bleeding, fever greater than 100.4, foul smelling vaginal discharge or other concerns. The patient will be return to clinic in two weeks for discussion of results. Rx for Emla cream should she need it was provided.  Physical Exam Genitourinary:     Left Labia: lesions.

## 2021-04-26 LAB — SURGICAL PATHOLOGY

## 2021-05-05 NOTE — Discharge Instructions (Signed)

## 2021-05-06 ENCOUNTER — Ambulatory Visit: Payer: No Typology Code available for payment source | Admitting: Anesthesiology

## 2021-05-06 ENCOUNTER — Encounter: Payer: Self-pay | Admitting: Gastroenterology

## 2021-05-06 ENCOUNTER — Other Ambulatory Visit: Payer: Self-pay

## 2021-05-06 ENCOUNTER — Encounter
Admission: RE | Disposition: A | Discharge status: Home / Self Care | Payer: Self-pay | Source: Home / Self Care | Attending: Gastroenterology

## 2021-05-06 ENCOUNTER — Ambulatory Visit
Admission: RE | Admit: 2021-05-06 | Discharge: 2021-05-06 | Disposition: A | Discharge status: Home / Self Care | Payer: No Typology Code available for payment source | Attending: Gastroenterology | Admitting: Gastroenterology

## 2021-05-06 DIAGNOSIS — Z79899 Other long term (current) drug therapy: Secondary | ICD-10-CM | POA: Insufficient documentation

## 2021-05-06 DIAGNOSIS — D120 Benign neoplasm of cecum: Secondary | ICD-10-CM | POA: Insufficient documentation

## 2021-05-06 DIAGNOSIS — K635 Polyp of colon: Secondary | ICD-10-CM

## 2021-05-06 DIAGNOSIS — Z1211 Encounter for screening for malignant neoplasm of colon: Secondary | ICD-10-CM | POA: Diagnosis not present

## 2021-05-06 HISTORY — PX: POLYPECTOMY: SHX5525

## 2021-05-06 HISTORY — PX: COLONOSCOPY WITH PROPOFOL: SHX5780

## 2021-05-06 LAB — POCT PREGNANCY, URINE: Preg Test, Ur: NEGATIVE

## 2021-05-06 SURGERY — COLONOSCOPY WITH PROPOFOL
Anesthesia: General | Site: Rectum

## 2021-05-06 MED ORDER — LIDOCAINE HCL (CARDIAC) PF 100 MG/5ML IV SOSY
PREFILLED_SYRINGE | INTRAVENOUS | Status: DC | PRN
  Administered 2021-05-06: 30 mg via INTRAVENOUS

## 2021-05-06 MED ORDER — SODIUM CHLORIDE 0.9 % IV SOLN: INTRAVENOUS | Status: DC

## 2021-05-06 MED ORDER — STERILE WATER FOR IRRIGATION IR SOLN
Status: DC | PRN
  Administered 2021-05-06: .05 mL

## 2021-05-06 MED ORDER — ACETAMINOPHEN 160 MG/5ML PO SOLN: 325.0000 mg | Freq: Once | ORAL | Status: DC

## 2021-05-06 MED ORDER — ACETAMINOPHEN 325 MG PO TABS: 325.0000 mg | ORAL_TABLET | Freq: Once | ORAL | Status: DC

## 2021-05-06 MED ORDER — LACTATED RINGERS IV SOLN: INTRAVENOUS | Status: DC

## 2021-05-06 MED ORDER — PROPOFOL 10 MG/ML IV BOLUS
INTRAVENOUS | Status: DC | PRN
  Administered 2021-05-06: 150 mg via INTRAVENOUS
  Administered 2021-05-06: 30 mg via INTRAVENOUS
  Administered 2021-05-06: 50 mg via INTRAVENOUS
  Administered 2021-05-06: 20 mg via INTRAVENOUS

## 2021-05-06 SURGICAL SUPPLY — 9 items
FORCEPS BIOP RAD 4 LRG CAP 4 (CUTTING FORCEPS) ×3 IMPLANT
GOWN CVR UNV OPN BCK APRN NK (MISCELLANEOUS) ×4 IMPLANT
GOWN ISOL THUMB LOOP REG UNIV (MISCELLANEOUS) ×6
KIT PRC NS LF DISP ENDO (KITS) ×2 IMPLANT
KIT PROCEDURE OLYMPUS (KITS) ×3
MANIFOLD NEPTUNE II (INSTRUMENTS) ×3 IMPLANT
SNARE COLD EXACTO (MISCELLANEOUS) ×3 IMPLANT
TRAP ETRAP POLY (MISCELLANEOUS) ×3 IMPLANT
WATER STERILE IRR 250ML POUR (IV SOLUTION) ×3 IMPLANT

## 2021-05-06 NOTE — Transfer of Care (Signed)
Immediate Anesthesia Transfer of Care Note  Patient: Jeanette Townsend  Procedure(s) Performed: COLONOSCOPY WITH PROPOFOL (N/A Rectum) POLYPECTOMY (Rectum)  Patient Location: PACU  Anesthesia Type: General  Level of Consciousness: awake, alert  and patient cooperative  Airway and Oxygen Therapy: Patient Spontanous Breathing and Patient connected to supplemental oxygen  Post-op Assessment: Post-op Vital signs reviewed, Patient's Cardiovascular Status Stable, Respiratory Function Stable, Patent Airway and No signs of Nausea or vomiting  Post-op Vital Signs: Reviewed and stable  Complications: No complications documented.

## 2021-05-06 NOTE — Anesthesia Procedure Notes (Signed)
Date/Time: 05/06/2021 8:10 AM Performed by: Cameron Ali, CRNA Pre-anesthesia Checklist: Patient identified, Emergency Drugs available, Suction available, Timeout performed and Patient being monitored Patient Re-evaluated:Patient Re-evaluated prior to induction Oxygen Delivery Method: Nasal cannula Placement Confirmation: positive ETCO2

## 2021-05-06 NOTE — Op Note (Signed)
[facility] Gastroenterology Patient Name: Jeanette Townsend Procedure Date: 05/06/2021 8:03 AM MRN: [id number] Account #: [id number] Date of Birth: [birth year] Admit Type: Outpatient Age: 50 Room: [facility] OR ROOM 01 Gender: Female Note Status: Finalized Procedure:             Colonoscopy Indications:           Screening for colorectal malignant neoplasm Providers:             Lucilla Lame MD, MD Referring MD:          Stoney Bang. Staebler (Referring MD) Medicines:             Propofol per Anesthesia Complications:         No immediate complications. Procedure:             Pre-Anesthesia Assessment:                        - Prior to the procedure, a History and Physical was                         performed, and patient medications and allergies were                         reviewed. The patient's tolerance of previous                         anesthesia was also reviewed. The risks and benefits                         of the procedure and the sedation options and risks                         were discussed with the patient. All questions were                         answered, and informed consent was obtained. Prior                         Anticoagulants: The patient has taken no previous                         anticoagulant or antiplatelet agents. ASA Grade                         Assessment: II - A patient with mild systemic disease.                         After reviewing the risks and benefits, the patient                         was deemed in satisfactory condition to undergo the                         procedure.                        After obtaining informed consent, the colonoscope was  passed under direct vision. Throughout the procedure,                         the patient's blood pressure, pulse, and oxygen                         saturations were monitored continuously. The                         Colonoscope was introduced through the  anus and                         advanced to the the cecum, identified by appendiceal                         orifice and ileocecal valve. The colonoscopy was                         performed without difficulty. The patient tolerated                         the procedure well. The quality of the bowel                         preparation was excellent. Findings:      The perianal and digital rectal examinations were normal.      A 1 mm polyp was found in the cecum. The polyp was sessile. The polyp       was removed with a cold biopsy forceps. Resection and retrieval were       complete.      A 7 mm polyp was found in the cecum. The polyp was sessile. The polyp       was removed with a cold snare. Resection and retrieval were complete. Impression:            - One 1 mm polyp in the cecum, removed with a cold                         biopsy forceps. Resected and retrieved.                        - One 7 mm polyp in the cecum, removed with a cold                         snare. Resected and retrieved. Recommendation:        - Discharge patient to home.                        - Resume previous diet.                        - Continue present medications.                        - Await pathology results.                        - Repeat colonoscopy in 7 years for surveillance if  adenoma and 10 years if hyperplastic. Procedure Code(s):     --- Professional ---                        [phone], Colonoscopy, flexible; with removal of                         tumor(s), polyp(s), or other lesion(s) by snare                         technique                        [id number], 37, Colonoscopy, flexible; with biopsy, single                         or multiple Diagnosis Code(s):     --- Professional ---                        Z12.11, Encounter for screening for malignant neoplasm                         of colon                        K63.5, Polyp of colon CPT copyright 2019 American  Medical Association. All rights reserved. The codes documented in this report are preliminary and upon coder review may  be revised to meet current compliance requirements. Lucilla Lame MD, MD 05/06/2021 8:29:39 AM This report has been signed electronically. Number of Addenda: 0 Note Initiated On: 05/06/2021 8:03 AM Scope Withdrawal Time: 0 hours 7 minutes 43 seconds  Estimated Blood Loss:  Estimated blood loss: none.      [facility]

## 2021-05-06 NOTE — Anesthesia Postprocedure Evaluation (Signed)
Anesthesia Post Note  Patient: Jeanette Townsend  Procedure(s) Performed: COLONOSCOPY WITH PROPOFOL (N/A Rectum) POLYPECTOMY (Rectum)     Patient location during evaluation: PACU Anesthesia Type: General Level of consciousness: awake and alert and oriented Pain management: satisfactory to patient Vital Signs Assessment: post-procedure vital signs reviewed and stable Respiratory status: spontaneous breathing, nonlabored ventilation and respiratory function stable Cardiovascular status: blood pressure returned to baseline and stable Postop Assessment: Adequate PO intake and No signs of nausea or vomiting Anesthetic complications: no   No complications documented.  Raliegh Ip

## 2021-05-06 NOTE — H&P (Signed)
Lucilla Lame, MD [address] [place], [place] [address] Phone: [phone] Fax : [phone]  Primary Care Physician:  Malachy Mood, MD Primary Gastroenterologist:  Dr. Allen Norris  Pre-Procedure History & Physical: HPI:  Jeanette Townsend is a 50 y.o. female is here for a screening colonoscopy.   Past Medical History:  Diagnosis Date  . Allergy   . Anemia   . Asthma    childhood  . Excessive vaginal bleeding   . Fibroids     Past Surgical History:  Procedure Laterality Date  . DILATATION & CURETTAGE/HYSTEROSCOPY WITH MYOSURE N/A 05/26/2016   Procedure: DILATATION & CURETTAGE/HYSTEROSCOPY WITH MYOSURE RESECTION OF SUBMUCOSAL FIBROID;  Surgeon: Malachy Mood, MD;  Location: [facility] ORS;  Service: Gynecology;  Laterality: N/A;  . DILATION AND CURETTAGE OF UTERUS  08/2011  . HYSTEROSCOPY WITH D & C N/A 01/09/2020   Procedure: DILATATION AND CURETTAGE /HYSTEROSCOPY;  Surgeon: Malachy Mood, MD;  Location: [facility] ORS;  Service: Gynecology;  Laterality: N/A;  . INTRAUTERINE DEVICE (IUD) INSERTION N/A 01/09/2020   Procedure: INTRAUTERINE DEVICE (IUD) INSERTION;  Surgeon: Malachy Mood, MD;  Location: [facility] ORS;  Service: Gynecology;  Laterality: N/A;    Prior to Admission medications   Medication Sig Start Date End Date Taking? Authorizing Provider  Ascorbic Acid (VITAMIN C PO) Take 1,000 mg by mouth daily.    Yes [provider]  cetirizine (ZYRTEC) 10 MG tablet Take 10 mg by mouth daily as needed for allergies.   Yes [provider]  Cholecalciferol 25 MCG (1000 UT) tablet Take 1,000 Units by mouth daily.    Yes [provider]  lidocaine-prilocaine (EMLA) cream Apply 1 application topically as needed. 04/22/21  Yes Malachy Mood, MD  pimecrolimus (ELIDEL) 1 % cream Apply topically daily. 12/13/20  Yes [provider]  tranexamic acid (LYSTEDA) 650 MG TABS tablet Take 1,300 mg by mouth 3 (three) times daily.   Yes [provider]    Allergies as of 04/22/2021  . (No Known Allergies)    Family History  Problem Relation Age of Onset  . Hypertension Mother   . Diverticulitis Mother   . Breast cancer Neg Hx     Social History   Socioeconomic History  . Marital status: Married    Spouse name: Not on file  . Number of children: Not on file  . Years of education: Not on file  . Highest education level: Not on file  Occupational History  . Not on file  Tobacco Use  . Smoking status: Never Smoker  . Smokeless tobacco: Never Used  Vaping Use  . Vaping Use: Never used  Substance and Sexual Activity  . Alcohol use: No  . Drug use: No  . Sexual activity: Yes    Birth control/protection: I.U.D.  Other Topics Concern  . Not on file  Social History Narrative  . Not on file   Social Determinants of Health   Financial Resource Strain: Not on file  Food Insecurity: Not on file  Transportation Needs: Not on file  Physical Activity: Not on file  Stress: Not on file  Social Connections: Not on file  Intimate Partner Violence: Not on file    Review of Systems: See HPI, otherwise negative ROS  Physical Exam: BP (!) 146/88   Pulse 91   Temp (!) 97.3 F (36.3 C)   Resp 20   Wt 80.8 kg   SpO2 97%   BMI 30.57 kg/m  General:   Alert,  pleasant  and cooperative in NAD Head:  Normocephalic and atraumatic. Neck:  Supple; no masses or thyromegaly. Lungs:  Clear throughout to auscultation.    Heart:  Regular rate and rhythm. Abdomen:  Soft, nontender and nondistended. Normal bowel sounds, without guarding, and without rebound.   Neurologic:  Alert and  oriented x4;  grossly normal neurologically.  Impression/Plan: Jeanette Townsend is now here to undergo a screening colonoscopy.  Risks, benefits, and alternatives regarding colonoscopy have been reviewed with the patient.  Questions have been answered.  All parties agreeable.

## 2021-05-06 NOTE — Anesthesia Preprocedure Evaluation (Signed)
Anesthesia Evaluation  Patient identified by MRN, date of birth, ID band Patient awake    Reviewed: Allergy & Precautions, H&P , NPO status , Patient's Chart, lab work & pertinent test results  Airway Mallampati: II  TM Distance: >3 FB Neck ROM: full    Dental no notable dental hx.    Pulmonary asthma ,    Pulmonary exam normal breath sounds clear to auscultation       Cardiovascular Normal cardiovascular exam Rhythm:regular Rate:Normal     Neuro/Psych    GI/Hepatic   Endo/Other    Renal/GU      Musculoskeletal   Abdominal   Peds  Hematology   Anesthesia Other Findings   Reproductive/Obstetrics                             Anesthesia Physical Anesthesia Plan  ASA: II  Anesthesia Plan: General   Post-op Pain Management:    Induction: Intravenous  PONV Risk Score and Plan: 3 and Treatment may vary due to age or medical condition, Propofol infusion and TIVA  Airway Management Planned: Natural Airway  Additional Equipment:   Intra-op Plan:   Post-operative Plan:   Informed Consent: I have reviewed the patients History and Physical, chart, labs and discussed the procedure including the risks, benefits and alternatives for the proposed anesthesia with the patient or authorized representative who has indicated his/her understanding and acceptance.     Dental Advisory Given  Plan Discussed with: CRNA  Anesthesia Plan Comments:         Anesthesia Quick Evaluation

## 2021-05-07 ENCOUNTER — Encounter: Payer: Self-pay | Admitting: Gastroenterology

## 2021-05-07 LAB — SURGICAL PATHOLOGY

## 2021-05-10 ENCOUNTER — Encounter: Payer: Self-pay | Admitting: Gastroenterology

## 2022-01-06 ENCOUNTER — Encounter: Payer: Self-pay | Admitting: Internal Medicine

## 2022-05-30 ENCOUNTER — Encounter: Payer: Self-pay | Admitting: Family Medicine

## 2022-05-30 ENCOUNTER — Ambulatory Visit (INDEPENDENT_AMBULATORY_CARE_PROVIDER_SITE_OTHER): Payer: No Typology Code available for payment source | Admitting: Family Medicine

## 2022-05-30 VITALS — BP 140/90 | HR 94 | Ht 64.0 in | Wt 178.6 lb

## 2022-05-30 DIAGNOSIS — I10 Essential (primary) hypertension: Secondary | ICD-10-CM | POA: Diagnosis not present

## 2022-05-30 DIAGNOSIS — M722 Plantar fascial fibromatosis: Secondary | ICD-10-CM | POA: Insufficient documentation

## 2022-05-30 DIAGNOSIS — Z13220 Encounter for screening for lipoid disorders: Secondary | ICD-10-CM

## 2022-05-30 NOTE — Assessment & Plan Note (Signed)
EMR review reveals multiple episodes of hypertension, her BP is at 140/90 today.  She has been asymptomatic throughout all of this but does relay a family history in her father and mother for hypertension.  No treatments in the past.  Examination today reveals positive S1 and S2, regular rate and rhythm, no additional heart sounds, no JVD.  Lung fields are clear to auscultation bilaterally without wheezes, rales, rhonchi.  At this stage plan for risk stratification labs, lifestyle modification information provided to the patient, and follow-up for annual physical scheduled.  If persistent elevation noted, pending labs, can consider pharmacotherapy.

## 2022-05-30 NOTE — Patient Instructions (Addendum)
-   Obtain fasting labs with orders provided (can have water or black coffee but otherwise no food or drink x 8 hours before labs) - Review information provided - Start home exercises with information provided - Touch base with OB/GYN - Return in 1 month for physical - Contact us for any questions between now and then

## 2022-05-30 NOTE — Progress Notes (Signed)
     Primary Care / Sports Medicine Office Visit  Patient Information:  Patient ID: Jeanette Townsend, female DOB: May 02, 1971 Age: 51 y.o. MRN: [id number]   Jeanette Townsend is a pleasant 51 y.o. female presenting with the following:  Chief Complaint  Patient presents with   Establish Care    Pt here to establish care, wants to talk about BP that has been increasing.     Vitals:   05/30/22 1333  BP: 140/90  Pulse: 94  SpO2: 97%   Vitals:   05/30/22 1333  Weight: 178 lb 9.6 oz (81 kg)  Height: '5\' 4"'$  (1.626 m)   Body mass index is 30.66 kg/m.  No results found.   Independent interpretation of notes and tests performed by another provider:   None  Procedures performed:   None  Pertinent History, Exam, Impression, and Recommendations:   Problem List Items Addressed This Visit       Cardiovascular and Mediastinum   Hypertension - Primary    EMR review reveals multiple episodes of hypertension, her BP is at 140/90 today.  She has been asymptomatic throughout all of this but does relay a family history in her father and mother for hypertension.  No treatments in the past.  Examination today reveals positive S1 and S2, regular rate and rhythm, no additional heart sounds, no JVD.  Lung fields are clear to auscultation bilaterally without wheezes, rales, rhonchi.  At this stage plan for risk stratification labs, lifestyle modification information provided to the patient, and follow-up for annual physical scheduled.  If persistent elevation noted, pending labs, can consider pharmacotherapy.        Relevant Orders   Apo A1 + B + Ratio   Lipid panel   TSH   Comprehensive metabolic panel   CBC     Musculoskeletal and Integument   Plantar fasciitis, left    Chronic issue with recurrent symptomatology, localized about the left hindfoot medially, states that she has had x-rays with bone spur noted, pain interferes with daily activities.  She states that she has had 2 prior  cortisone injections, doses diclofenac on as-needed basis, and is consistent with regular rolling through her plantar fascia.   Examination reveals tenderness along the medial aspect of the plantar fascia in the vicinity of the calcaneus, subtle tightness of the posterior chain noted.  Given her clinical course I have advised home-based rehab with added focus on the posterior chain, can continue sporadic dosing of diclofenac given comorbid hypertension, and for recalcitrant symptomatology corticosteroid injection to be considered.       Other Visit Diagnoses     Screening for lipoid disorders       Relevant Orders   Apo A1 + B + Ratio   Lipid panel        Orders & Medications No orders of the defined types were placed in this encounter.  Orders Placed This Encounter  Procedures   Apo A1 + B + Ratio   Lipid panel   TSH   Comprehensive metabolic panel   CBC     Return in about 1 month (around 06/29/2022).     Montel Culver, MD   [facility] [place]

## 2022-05-30 NOTE — Assessment & Plan Note (Signed)
Chronic issue with recurrent symptomatology, localized about the left hindfoot medially, states that she has had x-rays with bone spur noted, pain interferes with daily activities.  She states that she has had 2 prior cortisone injections, doses diclofenac on as-needed basis, and is consistent with regular rolling through her plantar fascia.   Examination reveals tenderness along the medial aspect of the plantar fascia in the vicinity of the calcaneus, subtle tightness of the posterior chain noted.  Given her clinical course I have advised home-based rehab with added focus on the posterior chain, can continue sporadic dosing of diclofenac given comorbid hypertension, and for recalcitrant symptomatology corticosteroid injection to be considered.

## 2022-06-01 LAB — CBC
Hematocrit: 44.8 % (ref 34.0–46.6)
Hemoglobin: 15.3 g/dL (ref 11.1–15.9)
MCH: 30.2 pg (ref 26.6–33.0)
MCHC: 34.2 g/dL (ref 31.5–35.7)
MCV: 88 fL (ref 79–97)
Platelets: 310 10*3/uL (ref 150–450)
RBC: 5.07 x10E6/uL (ref 3.77–5.28)
RDW: 12.6 % (ref 11.7–15.4)
WBC: 8.2 10*3/uL (ref 3.4–10.8)

## 2022-06-01 LAB — COMPREHENSIVE METABOLIC PANEL
ALT: 51 IU/L — ABNORMAL HIGH (ref 0–32)
AST: 37 IU/L (ref 0–40)
Albumin/Globulin Ratio: 1.4 (ref 1.2–2.2)
Albumin: 4.2 g/dL (ref 3.8–4.8)
Alkaline Phosphatase: 73 IU/L (ref 44–121)
BUN/Creatinine Ratio: 21 (ref 9–23)
BUN: 12 mg/dL (ref 6–24)
Bilirubin Total: 0.6 mg/dL (ref 0.0–1.2)
CO2: 24 mmol/L (ref 20–29)
Calcium: 9.4 mg/dL (ref 8.7–10.2)
Chloride: 99 mmol/L (ref 96–106)
Creatinine, Ser: 0.56 mg/dL — ABNORMAL LOW (ref 0.57–1.00)
Globulin, Total: 2.9 g/dL (ref 1.5–4.5)
Glucose: 101 mg/dL — ABNORMAL HIGH (ref 70–99)
Potassium: 4.3 mmol/L (ref 3.5–5.2)
Sodium: 137 mmol/L (ref 134–144)
Total Protein: 7.1 g/dL (ref 6.0–8.5)
eGFR: 113 mL/min/{1.73_m2} (ref >59)

## 2022-06-01 LAB — LIPID PANEL
Chol/HDL Ratio: 3.7 ratio (ref 0.0–4.4)
Cholesterol, Total: 190 mg/dL (ref 100–199)
HDL: 51 mg/dL (ref >39)
LDL Chol Calc (NIH): 111 mg/dL — ABNORMAL HIGH (ref 0–99)
Triglycerides: 159 mg/dL — ABNORMAL HIGH (ref 0–149)
VLDL Cholesterol Cal: 28 mg/dL (ref 5–40)

## 2022-06-01 LAB — APO A1 + B + RATIO
Apolipo. B/A-1 Ratio: 0.7 ratio — ABNORMAL HIGH (ref 0.0–0.6)
Apolipoprotein A-1: 143 mg/dL (ref 116–209)
Apolipoprotein B: 100 mg/dL — ABNORMAL HIGH (ref <90)

## 2022-06-01 LAB — TSH: TSH: 2.11 u[IU]/mL (ref 0.450–4.500)

## 2022-06-06 LAB — HGB A1C W/O EAG: Hgb A1c MFr Bld: 5.7 % — ABNORMAL HIGH (ref 4.8–5.6)

## 2022-06-06 LAB — SPECIMEN STATUS REPORT

## 2022-06-15 DIAGNOSIS — N390 Urinary tract infection, site not specified: Secondary | ICD-10-CM | POA: Insufficient documentation

## 2022-06-21 ENCOUNTER — Encounter: Payer: Self-pay | Admitting: Obstetrics & Gynecology

## 2022-06-21 ENCOUNTER — Other Ambulatory Visit (HOSPITAL_COMMUNITY)
Admission: RE | Admit: 2022-06-21 | Discharge: 2022-06-21 | Disposition: A | Discharge status: Home / Self Care | Payer: No Typology Code available for payment source | Source: Ambulatory Visit | Attending: Obstetrics & Gynecology | Admitting: Obstetrics & Gynecology

## 2022-06-21 ENCOUNTER — Ambulatory Visit (INDEPENDENT_AMBULATORY_CARE_PROVIDER_SITE_OTHER): Payer: Self-pay | Admitting: Obstetrics & Gynecology

## 2022-06-21 VITALS — BP 130/90 | Ht 64.0 in | Wt 179.0 lb

## 2022-06-21 DIAGNOSIS — D219 Benign neoplasm of connective and other soft tissue, unspecified: Secondary | ICD-10-CM

## 2022-06-21 DIAGNOSIS — Z124 Encounter for screening for malignant neoplasm of cervix: Secondary | ICD-10-CM | POA: Diagnosis present

## 2022-06-21 DIAGNOSIS — R1032 Left lower quadrant pain: Secondary | ICD-10-CM | POA: Diagnosis not present

## 2022-06-21 DIAGNOSIS — Z01411 Encounter for gynecological examination (general) (routine) with abnormal findings: Secondary | ICD-10-CM

## 2022-06-21 DIAGNOSIS — Z01419 Encounter for gynecological examination (general) (routine) without abnormal findings: Secondary | ICD-10-CM

## 2022-06-21 DIAGNOSIS — Z1239 Encounter for other screening for malignant neoplasm of breast: Secondary | ICD-10-CM

## 2022-06-21 NOTE — Progress Notes (Signed)
Subjective:    Jeanette Townsend is a 51 y.o. fyo married Hispanic (her parents are from [place]) G0 who presents for an annual exam. She is concerned that her known fibroids are the cause of her new onset LLQ pain. IBU is some help but sh would like an ultrasound to follow up on the fibroids. She has a [employer] fast streamer and is happy with this method. She has some light spotting but nothing heavy. She is sexually active. The patient wears seatbelts: yes. The patient participates in regular exercise: yes. Has the patient ever been transfused or tattooed?: no. The patient reports that there is not domestic violence in her life.   Menstrual History: OB History     Gravida  0   Para  0   Term  0   Preterm  0   AB  0   Living  0      SAB  0   IAB  0   Ectopic  0   Multiple  0   Live Births  0           No LMP recorded. (Menstrual status: IUD).    The following portions of the patient's history were reviewed and updated as appropriate: allergies, current medications, past family history, past medical history, past social history, past surgical history, and problem list.  Review of Systems Pertinent items are noted in HPI.  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 She has been married for 26 years.   Objective:    BP 130/90   Ht 5\' 4"  (1.626 m)   Wt 179 lb (81.2 kg)   BMI 30.73 kg/m   General Appearance:    Alert, cooperative, no distress, appears stated age  Head:    Normocephalic, without obvious abnormality, atraumatic  Eyes:    PERRL, conjunctiva/corneas clear, EOM's intact, fundi    benign, both eyes  Ears:    Normal TM's and external ear canals, both ears  Nose:   Nares normal, septum midline, mucosa normal, no drainage    or sinus tenderness  Throat:   Lips, mucosa, and tongue normal; teeth and gums normal  Neck:   Supple, symmetrical, trachea midline, no adenopathy;    thyroid:  no enlargement/tenderness/nodules; no carotid   bruit or JVD  Back:     Symmetric,  no curvature, ROM normal, no CVA tenderness  Lungs:     Clear to auscultation bilaterally, respirations unlabored  Chest Wall:    No tenderness or deformity   Heart:    Regular rate and rhythm, S1 and S2 normal, no murmur, rub   or gallop  Breast Exam:    No tenderness, masses, or nipple abnormality  Abdomen:     Soft, non-tender, bowel sounds active all four quadrants,    no masses, no organomegaly  Genitalia:   Normal female without lesion, discharge or tenderness, normal size and shape, retroverted, normal adnexal exam Mirena string seen, about 3 cm long  Rectal:    deferred  Extremities:   Extremities normal, atraumatic, no cyanosis or edema  Pulses:   2+ and symmetric all extremities  Skin:   Skin color, texture, turgor normal, no rashes or lesions  Lymph nodes:   Cervical, supraclavicular, and axillary nodes normal  Neurologic:   CNII-XII intact, normal strength, sensation and reflexes    throughout  .    Assessment:    Healthy female exam.  LLQ pain and known fibroids   Plan:     Await pap  smear results. Pelvic ultrasound.

## 2022-06-29 ENCOUNTER — Encounter: Payer: Self-pay | Admitting: Internal Medicine

## 2022-06-29 ENCOUNTER — Encounter: Payer: Self-pay | Admitting: Family Medicine

## 2022-06-29 ENCOUNTER — Ambulatory Visit: Payer: No Typology Code available for payment source | Admitting: Family Medicine

## 2022-06-29 VITALS — BP 130/86 | HR 78 | Ht 64.0 in | Wt 177.6 lb

## 2022-06-29 DIAGNOSIS — M722 Plantar fascial fibromatosis: Secondary | ICD-10-CM

## 2022-06-29 DIAGNOSIS — I10 Essential (primary) hypertension: Secondary | ICD-10-CM | POA: Diagnosis not present

## 2022-06-29 LAB — CYTOLOGY - PAP
Adequacy: ABNORMAL
Comment: NEGATIVE
High risk HPV: NEGATIVE

## 2022-06-29 MED ORDER — AMLODIPINE BESYLATE 5 MG PO TABS: 5.0000 mg | ORAL_TABLET | Freq: Every day | ORAL | 0 refills | Status: DC

## 2022-06-29 NOTE — Assessment & Plan Note (Signed)
Patient has been compliant with home exercises, has not required as needed diclofenac, and reports interval improvement.  Examination shows interval improvement in the tenderness at the plantar fascia at the calcaneus.  At this stage have advised her to continue with regular home exercises and once asymptomatic transition to maintenance home rehab, diclofenac can be used for breakthrough pain.

## 2022-06-29 NOTE — Progress Notes (Signed)
     Primary Care / Sports Medicine Office Visit  Patient Information:  Patient ID: Jeanette Townsend, female DOB: 1971-11-10 Age: 51 y.o. MRN: [id number]   Jeanette Townsend is a pleasant 51 y.o. female presenting with the following:  Chief Complaint  Patient presents with   Hypertension   Back Pain    Pt was seen on 06/21 for a UTI, was told she had E-Coli in her urine, finished antibiotics 1 week ago, is still having some pain, does have  GYN appointment for [place] for fibroids.     Vitals:   06/29/22 1032  BP: 130/86  Pulse: 78  SpO2: 96%   Vitals:   06/29/22 1032  Weight: 177 lb 9.6 oz (80.6 kg)  Height: '5\' 4"'$  (1.626 m)   Body mass index is 30.48 kg/m.  No results found.   Independent interpretation of notes and tests performed by another provider:   None  Procedures performed:   None  Pertinent History, Exam, Impression, and Recommendations:   Problem List Items Addressed This Visit       Cardiovascular and Mediastinum   Hypertension - Primary    Persistently elevated BP in setting of recently noted comorbid hyperlipidemia and prediabetes.  Cardiopulmonary findings today are benign. Given the persistent elevation and risk factors, plan for once daily amlodipine, continue to stress lifestyle modification, and we will follow-up on her response in 6 weeks at her annual physical.        Relevant Medications   amLODipine (NORVASC) 5 MG tablet     Musculoskeletal and Integument   Plantar fasciitis, left    Patient has been compliant with home exercises, has not required as needed diclofenac, and reports interval improvement.  Examination shows interval improvement in the tenderness at the plantar fascia at the calcaneus.  At this stage have advised her to continue with regular home exercises and once asymptomatic transition to maintenance home rehab, diclofenac can be used for breakthrough pain.        Orders & Medications Meds ordered this encounter  Medications    amLODipine (NORVASC) 5 MG tablet    Sig: Take 1 tablet (5 mg total) by mouth daily.    Dispense:  90 tablet    Refill:  0   No orders of the defined types were placed in this encounter.    Return in about 6 weeks (around 08/10/2022) for Annual Physical.     Montel Culver, MD   [place]

## 2022-06-29 NOTE — Patient Instructions (Addendum)
-   Start amlodipine once daily next - Continue healthy lifestyle changes - Continue home exercises for Planter fasciitis, once symptom-free, continue home exercises on a maintenance basis - Can use diclofenac for any breakthrough pain - Return for follow-up in 6 weeks for annual physical

## 2022-06-29 NOTE — Assessment & Plan Note (Addendum)
Persistently elevated BP in setting of recently noted comorbid hyperlipidemia and prediabetes.  Cardiopulmonary findings today are benign. Given the persistent elevation and risk factors, plan for once daily amlodipine, continue to stress lifestyle modification, and we will follow-up on her response in 6 weeks at her annual physical.

## 2022-07-04 ENCOUNTER — Encounter: Payer: Self-pay | Admitting: Obstetrics & Gynecology

## 2022-07-04 ENCOUNTER — Ambulatory Visit
Admission: RE | Admit: 2022-07-04 | Discharge: 2022-07-04 | Disposition: A | Discharge status: Home / Self Care | Payer: No Typology Code available for payment source | Source: Ambulatory Visit | Attending: Obstetrics & Gynecology | Admitting: Obstetrics & Gynecology

## 2022-07-04 DIAGNOSIS — D219 Benign neoplasm of connective and other soft tissue, unspecified: Secondary | ICD-10-CM | POA: Insufficient documentation

## 2022-07-04 DIAGNOSIS — R1032 Left lower quadrant pain: Secondary | ICD-10-CM | POA: Insufficient documentation

## 2022-07-07 ENCOUNTER — Ambulatory Visit: Payer: Self-pay | Admitting: Family Medicine

## 2022-07-13 ENCOUNTER — Ambulatory Visit
Admission: RE | Admit: 2022-07-13 | Discharge: 2022-07-13 | Disposition: A | Discharge status: Home / Self Care | Payer: PRIVATE HEALTH INSURANCE | Source: Ambulatory Visit | Attending: Obstetrics & Gynecology | Admitting: Obstetrics & Gynecology

## 2022-07-13 DIAGNOSIS — Z1231 Encounter for screening mammogram for malignant neoplasm of breast: Secondary | ICD-10-CM | POA: Insufficient documentation

## 2022-07-13 DIAGNOSIS — Z1239 Encounter for other screening for malignant neoplasm of breast: Secondary | ICD-10-CM | POA: Diagnosis present

## 2022-07-14 ENCOUNTER — Ambulatory Visit: Payer: Self-pay | Admitting: *Deleted

## 2022-07-14 ENCOUNTER — Other Ambulatory Visit: Payer: Self-pay | Admitting: Obstetrics & Gynecology

## 2022-07-14 DIAGNOSIS — N63 Unspecified lump in unspecified breast: Secondary | ICD-10-CM

## 2022-07-14 DIAGNOSIS — R928 Other abnormal and inconclusive findings on diagnostic imaging of breast: Secondary | ICD-10-CM

## 2022-07-14 NOTE — Telephone Encounter (Signed)
  Chief Complaint: SE- Amlodipine '5mg'$  Symptoms: facial flushing Frequency: stared Sunday and has continued Pertinent Negatives: Patient denies   Disposition: '[]'$ ED /'[]'$ Urgent Care (no appt availability in office) / '[]'$ Appointment(In office/virtual)/ '[]'$  [facility]/ '[]'$ Home Care/ '[]'$ Refused Recommended Disposition /'[]'$ [facility]/ '[x]'$  Follow-up with PCP Additional Notes:see triage note Reason for Disposition  [1] Caller has URGENT medicine question about med that PCP or specialist prescribed AND [2] triager unable to answer question  Answer Assessment - Initial Assessment Questions 1. NAME of MEDICINE: "What medicine(s) are you calling about?"     Amlodipine '5mg'$  2. QUESTION: "What is your question?" (e.g., double dose of medicine, side effect)     Facial flushing with medication 3. PRESCRIBER: "Who prescribed the medicine?" Reason: if prescribed by specialist, call should be referred to that group.     PCP 4. SYMPTOMS: "Do you have any symptoms?" If Yes, ask: "What symptoms are you having?"  "How bad are the symptoms (e.g., mild, moderate, severe)     Flushing in face- cheeks Patient thought she was getting diuretic BP medication and has questions about that. Patient states does have frequency of urination anyway. Patient started Amlodipine 8 days ago- noticed flushing Sunday- worse yesterday. Patient takes medication in afternoon at 1pm.  Patient stopped taking her Vit D and Calcium when she started BP medication- she was afraid of interactions- please advise her to continue.  Protocols used: Medication Question Call-A-AH

## 2022-07-14 NOTE — Telephone Encounter (Signed)
Called pt left VM to call back.  KP 

## 2022-07-14 NOTE — Telephone Encounter (Signed)
Patient called and advised per Dr. Zigmund Daniel below on 07/14/22, patient verbalized understanding, appointment scheduled for Thursday 07/21/22. She asked if she should stop the amlodipine, advised as per Dr. Zigmund Daniel if she's comfortable restarting and staying on Amlodipine she can do that. Patient verbalized understanding.

## 2022-07-14 NOTE — Telephone Encounter (Signed)
Please review. Pt is having side effects from taking amlodipine. Pt is having facial flushing. Appointment?  KP

## 2022-07-14 NOTE — Telephone Encounter (Signed)
Summary: discuss medication   Patient states she was prescribed amlodipine and it is making her face turn red and hot, patient states she was suppose to be prescribed a dieretic and inquiring if the medication prescribed is indeed dieretic   Please fu patient

## 2022-07-14 NOTE — Telephone Encounter (Signed)
Pts response.  KP

## 2022-07-21 ENCOUNTER — Encounter: Payer: Self-pay | Admitting: Internal Medicine

## 2022-07-21 ENCOUNTER — Encounter: Payer: Self-pay | Admitting: Family Medicine

## 2022-07-21 ENCOUNTER — Ambulatory Visit (INDEPENDENT_AMBULATORY_CARE_PROVIDER_SITE_OTHER): Payer: No Typology Code available for payment source | Admitting: Family Medicine

## 2022-07-21 VITALS — BP 132/82 | HR 90 | Ht 64.0 in | Wt 174.6 lb

## 2022-07-21 DIAGNOSIS — F419 Anxiety disorder, unspecified: Secondary | ICD-10-CM

## 2022-07-21 DIAGNOSIS — I10 Essential (primary) hypertension: Secondary | ICD-10-CM

## 2022-07-27 DIAGNOSIS — F419 Anxiety disorder, unspecified: Secondary | ICD-10-CM | POA: Insufficient documentation

## 2022-07-27 NOTE — Progress Notes (Signed)
     Primary Care / Sports Medicine Office Visit  Patient Information:  Patient ID: Jazzelle Zhang, female DOB: July 18, 1971 Age: 51 y.o. MRN: [id number]   Tigerlily Christine is a pleasant 51 y.o. female presenting with the following:  Chief Complaint  Patient presents with   medication follow up    More constipated within the last week, and face has been more flush.     Vitals:   07/21/22 0839  BP: 132/82  Pulse: 90  SpO2: 96%   Vitals:   07/21/22 0839  Weight: 174 lb 9.6 oz (79.2 kg)  Height: '5\' 4"'$  (1.626 m)   Body mass index is 29.97 kg/m.     Independent interpretation of notes and tests performed by another provider:   None  Procedures performed:   None  Pertinent History, Exam, Impression, and Recommendations:   Problem List Items Addressed This Visit       Cardiovascular and Mediastinum   Hypertension - Primary    Persistent elevation without change, this is despite amlodipine 5 mg dosing, patient has expressed concern over medication and side effects, lengthy discussion about the nature of this medication and expected side effects.  We will have patient continue amlodipine, work on nonpharmacologic methods for stress management, and place referral to cardiology for additional input.      Relevant Orders   Ambulatory referral to Cardiology     Other   Anxiety    Patient with recalcitrant hypertension demonstrating no interval change despite initiation of amlodipine, concern for anxiety component serving as a compounding element.  I discussed both pharmacologic and nonpharmacologic management options, she is not amenable to further medications at this time, as such I did provide management techniques which are attached to her patient plan.  We will follow-up on this issue at her return.        Orders & Medications No orders of the defined types were placed in this encounter.  Orders Placed This Encounter  Procedures   Ambulatory referral to Cardiology      Return in about 6 weeks (around 09/01/2022).     Montel Culver, MD   Primary Care Sports [facility] [place]

## 2022-07-27 NOTE — Assessment & Plan Note (Addendum)
Persistent elevation without change, this is despite amlodipine 5 mg dosing, patient has expressed concern over medication and side effects, lengthy discussion about the nature of this medication and expected side effects.  We will have patient continue amlodipine, work on nonpharmacologic methods for stress management, and place referral to cardiology for additional input.

## 2022-07-27 NOTE — Patient Instructions (Signed)
-   Continue amlodipine daily - Review information provided regarding stress management - Referral coordinator will contact you to schedule visit with cardiology - Return for follow-up in 6 weeks

## 2022-07-27 NOTE — Assessment & Plan Note (Signed)
Patient with recalcitrant hypertension demonstrating no interval change despite initiation of amlodipine, concern for anxiety component serving as a compounding element.  I discussed both pharmacologic and nonpharmacologic management options, she is not amenable to further medications at this time, as such I did provide management techniques which are attached to her patient plan.  We will follow-up on this issue at her return.

## 2022-08-15 ENCOUNTER — Ambulatory Visit: Payer: No Typology Code available for payment source | Admitting: Family Medicine

## 2022-08-30 ENCOUNTER — Ambulatory Visit
Admission: RE | Admit: 2022-08-30 | Discharge: 2022-08-30 | Disposition: A | Discharge status: Home / Self Care | Payer: No Typology Code available for payment source | Source: Ambulatory Visit | Attending: Obstetrics & Gynecology | Admitting: Obstetrics & Gynecology

## 2022-08-30 ENCOUNTER — Encounter: Payer: Self-pay | Admitting: Internal Medicine

## 2022-08-30 DIAGNOSIS — N63 Unspecified lump in unspecified breast: Secondary | ICD-10-CM

## 2022-08-30 DIAGNOSIS — R928 Other abnormal and inconclusive findings on diagnostic imaging of breast: Secondary | ICD-10-CM | POA: Diagnosis not present

## 2022-09-01 ENCOUNTER — Ambulatory Visit (INDEPENDENT_AMBULATORY_CARE_PROVIDER_SITE_OTHER): Payer: No Typology Code available for payment source | Admitting: Family Medicine

## 2022-09-01 ENCOUNTER — Encounter: Payer: Self-pay | Admitting: Family Medicine

## 2022-09-01 DIAGNOSIS — I10 Essential (primary) hypertension: Secondary | ICD-10-CM | POA: Diagnosis not present

## 2022-09-01 DIAGNOSIS — F419 Anxiety disorder, unspecified: Secondary | ICD-10-CM | POA: Diagnosis not present

## 2022-09-01 NOTE — Progress Notes (Signed)
Primary Care / Sports Medicine Office Visit  Patient Information:  Patient ID: Jeanette Townsend, female DOB: 28-Feb-1971 Age: 51 y.o. MRN: [id number]   Jeanette Townsend is a pleasant 51 y.o. female presenting with the following:  Chief Complaint  Patient presents with   Hypertension    Pt has appointment with cardiology 09/29/22.     Vitals:   09/01/22 1327  BP: 128/82  Pulse: 78  SpO2: 98%   Vitals:   09/01/22 1327  Weight: 172 lb 9.6 oz (78.3 kg)  Height: '5\' 4"'$  (1.626 m)   Body mass index is 29.63 kg/m.  MM DIAG BREAST TOMO UNI RIGHT  Result Date: 08/30/2022 CLINICAL DATA:  51 year old female presenting as a recall from screening for possible right breast masses EXAM: DIGITAL DIAGNOSTIC UNILATERAL RIGHT MAMMOGRAM WITH TOMOSYNTHESIS; ULTRASOUND RIGHT BREAST LIMITED TECHNIQUE: Right digital diagnostic mammography and breast tomosynthesis was performed.; Targeted ultrasound examination of the right breast was performed COMPARISON:  Previous exam(s). ACR Breast Density Category c: The breast tissue is heterogeneously dense, which may obscure small masses. FINDINGS: Mammogram: Spot compression tomosynthesis views of the right breast were performed demonstrating a persistent oval circumscribed mass in the central superior right breast measuring approximately 1.0 cm. There is also a persistent cluster of masses in the lower inner right breast measuring approximately 0.9 cm. Ultrasound: Targeted ultrasound is performed in the right breast at 12 o'clock 2 cm from the nipple demonstrating an oval circumscribed anechoic mass with posterior enhancement measuring 1.0 x 0.7 x 0.8 cm, consistent with a benign simple cyst. This corresponds to the mammographic finding. Targeted ultrasound in the right breast at 4 o'clock demonstrates a cluster of circumscribed anechoic masses with intervening septation consistent with a cluster of cysts. This measures 0.9 x 0.4 x 0.6 cm. This corresponds to the mammographic  finding. IMPRESSION: 1.  Benign simple cyst in the right breast at 12 o'clock. 2.  Benign cluster of cysts in the right breast at 4 o'clock. RECOMMENDATION: Screening mammogram in one year.(Code:SM-B-01Y) I have discussed the findings and recommendations with the patient. If applicable, a reminder letter will be sent to the patient regarding the next appointment. BI-RADS CATEGORY  2: Benign. Electronically Signed   By: Audie Pinto M.D.   On: 08/30/2022 11:20   US BREAST LTD UNI RIGHT INC AXILLA  Result Date: 08/30/2022 CLINICAL DATA:  51 year old female presenting as a recall from screening for possible right breast masses EXAM: DIGITAL DIAGNOSTIC UNILATERAL RIGHT MAMMOGRAM WITH TOMOSYNTHESIS; ULTRASOUND RIGHT BREAST LIMITED TECHNIQUE: Right digital diagnostic mammography and breast tomosynthesis was performed.; Targeted ultrasound examination of the right breast was performed COMPARISON:  Previous exam(s). ACR Breast Density Category c: The breast tissue is heterogeneously dense, which may obscure small masses. FINDINGS: Mammogram: Spot compression tomosynthesis views of the right breast were performed demonstrating a persistent oval circumscribed mass in the central superior right breast measuring approximately 1.0 cm. There is also a persistent cluster of masses in the lower inner right breast measuring approximately 0.9 cm. Ultrasound: Targeted ultrasound is performed in the right breast at 12 o'clock 2 cm from the nipple demonstrating an oval circumscribed anechoic mass with posterior enhancement measuring 1.0 x 0.7 x 0.8 cm, consistent with a benign simple cyst. This corresponds to the mammographic finding. Targeted ultrasound in the right breast at 4 o'clock demonstrates a cluster of circumscribed anechoic masses with intervening septation consistent with a cluster of cysts. This measures 0.9 x 0.4 x 0.6 cm. This corresponds to the mammographic  finding. IMPRESSION: 1.  Benign simple cyst in the right  breast at 12 o'clock. 2.  Benign cluster of cysts in the right breast at 4 o'clock. RECOMMENDATION: Screening mammogram in one year.(Code:SM-B-01Y) I have discussed the findings and recommendations with the patient. If applicable, a reminder letter will be sent to the patient regarding the next appointment. BI-RADS CATEGORY  2: Benign. Electronically Signed   By: Audie Pinto M.D.   On: 08/30/2022 11:20    Independent interpretation of notes and tests performed by another provider:   None  Procedures performed:   None  Pertinent History, Exam, Impression, and Recommendations:   Problem List Items Addressed This Visit       Cardiovascular and Mediastinum   Hypertension    Patient has demonstrated excellent interval decrease in blood pressure, this could be attributable to consistent usage of amlodipine, demonstrated interval weight loss, and patient has been performing 4 days/week cardiovascular exercise (elliptical).  She states that stress levels have been low.  She does have a history of palpitations secondary to iron deficiency anemia which has since resolved.  Does have upcoming visit with cardiology which I have encouraged her to maintain for their input and further optimization of regimen.  She will return for follow-up in 3 months for routine follow-up hypertension.        Other   Anxiety    Chronic issue, currently well controlled, scores have been reassuring, as has blood pressure on today's visit.  We will continue monitor this issue and have encouraged regular physical activity as a nonpharmacologic method to control symptoms.        Orders & Medications No orders of the defined types were placed in this encounter.  No orders of the defined types were placed in this encounter.    Return in about 3 months (around 12/01/2022).     Montel Culver, MD   Primary Care Sports [facility] [place]

## 2022-09-01 NOTE — Patient Instructions (Addendum)
-   Continue amlodipine - Continue healthy lifestyle behaviors (diet/exercise) - Maintain follow-up with cardiology - Return for follow-up in 3 months - Contact us for any questions between now and then

## 2022-09-01 NOTE — Assessment & Plan Note (Signed)
Patient has demonstrated excellent interval decrease in blood pressure, this could be attributable to consistent usage of amlodipine, demonstrated interval weight loss, and patient has been performing 4 days/week cardiovascular exercise (elliptical).  She states that stress levels have been low.  She does have a history of palpitations secondary to iron deficiency anemia which has since resolved.  Does have upcoming visit with cardiology which I have encouraged her to maintain for their input and further optimization of regimen.  She will return for follow-up in 3 months for routine follow-up hypertension.

## 2022-09-01 NOTE — Assessment & Plan Note (Signed)
Chronic issue, currently well controlled, scores have been reassuring, as has blood pressure on today's visit.  We will continue monitor this issue and have encouraged regular physical activity as a nonpharmacologic method to control symptoms.

## 2022-09-23 ENCOUNTER — Other Ambulatory Visit: Payer: Self-pay

## 2022-09-23 ENCOUNTER — Other Ambulatory Visit: Payer: Self-pay | Admitting: Family Medicine

## 2022-09-23 DIAGNOSIS — I10 Essential (primary) hypertension: Secondary | ICD-10-CM

## 2022-09-23 NOTE — Telephone Encounter (Signed)
Requested medication (s) are due for refill today: yes  Requested medication (s) are on the active medication list: yes  Last refill:  06/29/22 #90 0 refills  Future visit scheduled: yes in 2 months  Notes to clinic:  no refill. Do you want to refill Rx?     Requested Prescriptions  Pending Prescriptions Disp Refills   amLODipine (NORVASC) 5 MG tablet [Pharmacy Med Name: amLODIPine Besylate 5 MG Oral Tablet] 90 tablet 0    Sig: Take 1 tablet by mouth once daily     Cardiovascular: Calcium Channel Blockers 2 Passed - 09/23/2022  3:03 PM      Passed - Last BP in normal range    BP Readings from Last 1 Encounters:  09/01/22 128/82         Passed - Last Heart Rate in normal range    Pulse Readings from Last 1 Encounters:  09/01/22 78         Passed - Valid encounter within last 6 months    Recent Outpatient Visits           3 weeks ago [place] [facility] at [place], Earley Abide, MD   2 months ago Hypertension, unspecified type   [facility] Primary [facility] and Sports Medicine at [place], Earley Abide, MD   2 months ago Hypertension, unspecified type   [facility] Primary Care and Sports Medicine at [place], Earley Abide, MD   3 months ago Hypertension, unspecified type   [facility] Primary Care and Sports Medicine at [facility], Earley Abide, MD       Future Appointments             In 6 days Kate Sable, MD [place]. [place]   In 2 months Zigmund Daniel, Earley Abide, MD [place] and Sports Medicine at [facility], [facility]

## 2022-09-29 ENCOUNTER — Ambulatory Visit: Payer: No Typology Code available for payment source | Attending: Cardiology | Admitting: Cardiology

## 2022-09-29 ENCOUNTER — Encounter: Payer: Self-pay | Admitting: Cardiology

## 2022-09-29 VITALS — BP 128/90 | HR 75 | Ht 64.0 in | Wt 174.4 lb

## 2022-09-29 DIAGNOSIS — I10 Essential (primary) hypertension: Secondary | ICD-10-CM

## 2022-09-29 DIAGNOSIS — E7800 Pure hypercholesterolemia, unspecified: Secondary | ICD-10-CM | POA: Diagnosis not present

## 2022-09-29 NOTE — Patient Instructions (Signed)
Medication Instructions:   Your physician recommends that you continue on your current medications as directed. Please refer to the Current Medication list given to you today.   *If you need a refill on your cardiac medications before your next appointment, please call your pharmacy*    Follow-Up: At [facility], you and your health needs are our priority.  As part of our continuing mission to provide you with exceptional heart care, we have created designated Provider Care Teams.  These Care Teams include your primary Cardiologist (physician) and Advanced Practice Providers (APPs -  Physician Assistants and Nurse Practitioners) who all work together to provide you with the care you need, when you need it.  We recommend signing up for the patient portal called "MyChart".  Sign up information is provided on this After Visit Summary.  MyChart is used to connect with patients for Virtual Visits (Telemedicine).  Patients are able to view lab/test results, encounter notes, upcoming appointments, etc.  Non-urgent messages can be sent to your provider as well.   To learn more about what you can do with MyChart, go to [email or web address].    Your next appointment:   6 month(s)  The format for your next appointment:   In Person  Provider:   You may see Kate Sable, MD or one of the following Advanced Practice Providers on your designated Care Team:   Murray Hodgkins, NP Christell Faith, PA-C Cadence Kathlen Mody, PA-C Gerrie Nordmann, NP    Other Instructions   Important Information About Sugar

## 2022-09-29 NOTE — Progress Notes (Signed)
Cardiology Office Note:    Date:  09/29/2022   ID:  Keren Alverio, DOB [birth year], MRN [id number]  PCP:  Montel Culver, MD   [place] Prov[facility] Cardiologist:  Kate Sable, MD     Referring MD: Montel Culver, MD   Chief Complaint  Patient presents with   New Patient (Initial Visit)    HTN, no other cardiac concerns    History of Present Illness:    Jeanette Townsend is a 51 y.o. female with a hx of hypertension, anxiety who presents due to hypertension.  Patient states having elevated blood pressures about 2 years ago.  Systolics were in the high 140s.  Followed up with PCP, started on amlodipine 5 mg daily 4 months ago.  Since then her blood pressures have improved, systolics at home range in the 120s.  Denies chest pain or shortness of breath, denies smoking.  She exercises for about 25 minutes 4 times a week, also trying to eat low-salt diet.  Otherwise feels well, mother has a history of hypertension.  Past Medical History:  Diagnosis Date   Allergy    Anemia    Asthma    childhood   Excessive vaginal bleeding    Fibroids     Past Surgical History:  Procedure Laterality Date   COLONOSCOPY WITH PROPOFOL N/A 05/06/2021   Procedure: COLONOSCOPY WITH PROPOFOL;  Surgeon: Lucilla Lame, MD;  Location: [place];  Service: Endoscopy;  Laterality: N/A;   DILATATION & CURETTAGE/HYSTEROSCOPY WITH MYOSURE N/A 05/26/2016   Procedure: DILATATION & CURETTAGE/HYSTEROSCOPY WITH MYOSURE RESECTION OF SUBMUCOSAL FIBROID;  Surgeon: Malachy Mood, MD;  Location: [facility];  Service: Gynecology;  Laterality: N/A;   DILATION AND CURETTAGE OF UTERUS  08/2011   HYSTEROSCOPY WITH D & C N/A 01/09/2020   Procedure: DILATATION AND CURETTAGE /HYSTEROSCOPY;  Surgeon: Malachy Mood, MD;  Location: [facility];  Service: Gynecology;  Laterality: N/A;   INTRAUTERINE DEVICE (IUD) INSERTION N/A 01/09/2020   Procedure: INTRAUTERINE DEVICE (IUD) INSERTION;  Surgeon: Malachy Mood, MD;  Location: [facility];  Service: Gynecology;  Laterality: N/A;   POLYPECTOMY  05/06/2021   Procedure: POLYPECTOMY;  Surgeon: Lucilla Lame, MD;  Location: [facility];  Service: Endoscopy;;    Current Medications: Current Meds  Medication Sig   amLODipine (NORVASC) 5 MG tablet Take 1 tablet (5 mg total) by mouth daily.   levonorgestrel (MIRENA) 20 MCG/DAY IUD 1 each by Intrauterine route once.     Allergies:   Patient has no known allergies.   Social History   Socioeconomic History   Marital status: Married    Spouse name: Not on file   Number of children: Not on file   Years of education: Not on file   Highest education level: Not on file  Occupational History   Not on file  Tobacco Use   Smoking status: Never   Smokeless tobacco: Never  Vaping Use   Vaping Use: Never used  Substance and Sexual Activity   Alcohol use: No   Drug use: No   Sexual activity: Yes    Birth control/protection: I.U.D.  Other Topics Concern   Not on file  Social History Narrative   Not on file   Social Determinants of Health   Financial Resource Strain: Not on file  Food Insecurity: No Food Insecurity (09/01/2022)   Hunger Vital Sign    Worried About Running Out of Food in the Last Year: Never true    Ran Out of Food in  the Last Year: Never true  Transportation Needs: No Transportation Needs (09/01/2022)   PRAPARE - Hydrologist (Medical): No    Lack of Transportation (Non-Medical): No  Physical Activity: Not on file  Stress: Not on file  Social Connections: Not on file     Family History: The patient's family history includes Diverticulitis in her mother; Hypertension in her father and mother. There is no history of Breast cancer.  ROS:   Please see the history of present illness.     All other systems reviewed and are negative.  EKGs/Labs/Other Studies Reviewed:    The following studies were reviewed today:   EKG:  EKG is  ordered  today.  The ekg ordered today demonstrates normal sinus rhythm, normal ECG  Recent Labs: 05/31/2022: ALT 51; BUN 12; Creatinine, Ser 0.56; Hemoglobin 15.3; Platelets 310; Potassium 4.3; Sodium 137; TSH 2.110  Recent Lipid Panel    Component Value Date/Time   CHOL 190 05/31/2022 0811   TRIG 159 (H) 05/31/2022 0811   HDL 51 05/31/2022 0811   CHOLHDL 3.7 05/31/2022 0811   LDLCALC 111 (H) 05/31/2022 0811     Risk Assessment/Calculations:     HYPERTENSION CONTROL Vitals:   09/29/22 0817 09/29/22 0825  BP: 124/88 (!) 128/90    The patient's blood pressure is elevated above target today.  In order to address the patient's elevated BP: Blood pressure will be monitored at home to determine if medication changes need to be made.            Physical Exam:    VS:  BP (!) 128/90 (BP Location: Left Arm, Patient Position: Sitting, Cuff Size: Normal)   Pulse 75   Ht '5\' 4"'$  (1.626 m)   Wt 174 lb 6.4 oz (79.1 kg)   SpO2 98%   BMI 29.94 kg/m     Wt Readings from Last 3 Encounters:  09/29/22 174 lb 6.4 oz (79.1 kg)  09/01/22 172 lb 9.6 oz (78.3 kg)  07/21/22 174 lb 9.6 oz (79.2 kg)     GEN:  Well nourished, well developed in no acute distress HEENT: Normal NECK: No JVD; No carotid bruits LYMPHATICS: No lymphadenopathy CARDIAC: RRR, no murmurs, rubs, gallops RESPIRATORY:  Clear to auscultation without rales, wheezing or rhonchi  ABDOMEN: Soft, non-tender, non-distended MUSCULOSKELETAL:  No edema; No deformity  SKIN: Warm and dry NEUROLOGIC:  Alert and oriented x 3 PSYCHIATRIC:  Normal affect   ASSESSMENT:    1. Hypertension, unspecified type   2. Pure hypercholesterolemia    PLAN:    In order of problems listed above:  Hypertension, blood pressure improved with starting amlodipine.  Continue low-salt diet, continue taking amlodipine 5 mg daily as prescribed.  Exercising also recommended.  Hopefully combination of diet, exercise and medical therapy keeps blood pressure  controlled and or improved.  No need to titrate medication at this time.  Patient is otherwise asymptomatic. Hyperlipidemia, not in statin benefit group, low-cholesterol diet, exercise recommended.  Follow-up in 6 months.     Medication Adjustments/Labs and Tests Ordered: Current medicines are reviewed at length with the patient today.  Concerns regarding medicines are outlined above.  Orders Placed This Encounter  Procedures   EKG 12-Lead   No orders of the defined types were placed in this encounter.   Patient Instructions  Medication Instructions:   Your physician recommends that you continue on your current medications as directed. Please refer to the Current Medication list given to you today.   *  If you need a refill on your cardiac medications before your next appointment, please call your pharmacy*    Follow-Up: At [facility], you and your health needs are our priority.  As part of our continuing mission to provide you with exceptional heart care, we have created designated Provider Care Teams.  These Care Teams include your primary Cardiologist (physician) and Advanced Practice Providers (APPs -  Physician Assistants and Nurse Practitioners) who all work together to provide you with the care you need, when you need it.  We recommend signing up for the patient portal called "MyChart".  Sign up information is provided on this After Visit Summary.  MyChart is used to connect with patients for Virtual Visits (Telemedicine).  Patients are able to view lab/test results, encounter notes, upcoming appointments, etc.  Non-urgent messages can be sent to your provider as well.   To learn more about what you can do with MyChart, go to [email or web address].    Your next appointment:   6 month(s)  The format for your next appointment:   In Person  Provider:   You may see Kate Sable, MD or one of the following Advanced Practice Providers on your designated Care  Team:   Murray Hodgkins, NP Christell Faith, PA-C Cadence Kathlen Mody, PA-C Gerrie Nordmann, NP    Other Instructions   Important Information About Sugar         Signed, Kate Sable, MD  09/29/2022 9:07 AM    [place]

## 2022-12-01 ENCOUNTER — Encounter: Payer: Self-pay | Admitting: Family Medicine

## 2022-12-01 ENCOUNTER — Ambulatory Visit (INDEPENDENT_AMBULATORY_CARE_PROVIDER_SITE_OTHER): Payer: No Typology Code available for payment source | Admitting: Family Medicine

## 2022-12-01 VITALS — BP 130/94 | HR 94 | Ht 64.0 in | Wt 174.0 lb

## 2022-12-01 DIAGNOSIS — I10 Essential (primary) hypertension: Secondary | ICD-10-CM

## 2022-12-01 DIAGNOSIS — F419 Anxiety disorder, unspecified: Secondary | ICD-10-CM | POA: Diagnosis not present

## 2022-12-01 MED ORDER — AMLODIPINE BESYLATE 5 MG PO TABS: 5.0000 mg | ORAL_TABLET | Freq: Every day | ORAL | 1 refills | Status: DC

## 2022-12-01 NOTE — Assessment & Plan Note (Addendum)
Chronic condition, stable, without full control.  Did see cardiology last month who advised no medication change.  She regularly exercises roughly 3-4 days/week x 25 minutes.  She denies any cardiopulmonary complaints and her examination in that regard is benign.  I did discuss further titration of amlodipine from 5 mg to 10 mg, she has deferred that option, we will follow BP at her return for annual physical 05/2023, if persistently elevated, strong consideration to increase dose at that time. Continued lifestyle modifications encouraged over interim.

## 2022-12-01 NOTE — Patient Instructions (Addendum)
-   Continue current dose of blood pressure medication - Return for physical mid June 2024 (ideally come when fasting) - Contact for any questions / concerns between now and then

## 2022-12-03 NOTE — Assessment & Plan Note (Signed)
Chronic, stable, continue to monitor.

## 2022-12-03 NOTE — Progress Notes (Signed)
     Primary Care / Sports Medicine Office Visit  Patient Information:  Patient ID: Jeanette Townsend, female DOB: 1971-11-18 Age: 51 y.o. MRN: [id number]   Jeanette Townsend is a pleasant 51 y.o. female presenting with the following:  Chief Complaint  Patient presents with   Hypertension    Vitals:   12/01/22 1322 12/01/22 1324  BP: (!) 132/98 (!) 130/94  Pulse: 94   SpO2: 99%    Vitals:   12/01/22 1322  Weight: 174 lb (78.9 kg)  Height: '5\' 4"'$  (1.626 m)   Body mass index is 29.87 kg/m.  No results found.   Independent interpretation of notes and tests performed by another provider:   None  Procedures performed:   None  Pertinent History, Exam, Impression, and Recommendations:   Problem List Items Addressed This Visit       Cardiovascular and Mediastinum   Hypertension    Chronic condition, stable, without full control.  Did see cardiology last month who advised no medication change.  She regularly exercises roughly 3-4 days/week x 25 minutes.  She denies any cardiopulmonary complaints and her examination in that regard is benign.  I did discuss further titration of amlodipine from 5 mg to 10 mg, she has deferred that option, we will follow BP at her return for annual physical 05/2023, if persistently elevated, strong consideration to increase dose at that time. Continued lifestyle modifications encouraged over interim.      Relevant Medications   amLODipine (NORVASC) 5 MG tablet     Other   Anxiety - Primary    Chronic, stable, continue to monitor.        Orders & Medications Meds ordered this encounter  Medications   amLODipine (NORVASC) 5 MG tablet    Sig: Take 1 tablet (5 mg total) by mouth daily.    Dispense:  90 tablet    Refill:  1   No orders of the defined types were placed in this encounter.   I provided a total time of 30 minutes including both face-to-face and non-face-to-face time on 12/03/2022 inclusive of time utilized for medical chart review,  information gathering, care coordination with staff, and documentation completion.   Return in about 6 months (around 06/02/2023) for CPE.     Montel Culver, MD, [facility]   Primary Care Sports Medicine Primary Care and Sports Medicine at [facility]

## 2023-02-14 ENCOUNTER — Encounter: Payer: Self-pay | Admitting: Family Medicine

## 2023-02-14 ENCOUNTER — Ambulatory Visit: Payer: No Typology Code available for payment source | Admitting: Family Medicine

## 2023-02-14 VITALS — BP 128/82 | HR 64 | Ht 64.0 in | Wt 174.0 lb

## 2023-02-14 DIAGNOSIS — B9689 Other specified bacterial agents as the cause of diseases classified elsewhere: Secondary | ICD-10-CM | POA: Diagnosis not present

## 2023-02-14 DIAGNOSIS — J019 Acute sinusitis, unspecified: Secondary | ICD-10-CM | POA: Diagnosis not present

## 2023-02-14 MED ORDER — AMOXICILLIN-POT CLAVULANATE 875-125 MG PO TABS: 1.0000 | ORAL_TABLET | Freq: Two times a day (BID) | ORAL | 0 refills | Status: DC

## 2023-02-14 NOTE — Progress Notes (Signed)
     Primary Care / Sports Medicine Office Visit  Patient Information:  Patient ID: Jeanette Townsend, female DOB: Nov 23, 1971 Age: 52 y.o. MRN: [id number]   Jeanette Townsend is a pleasant 52 y.o. female presenting with the following:  Chief Complaint  Patient presents with   Nasal Congestion    Drainage for 10-12 days, Has tried, mucinex, zyrtec, sudafed, coricidin. With no relief    Vitals:   02/14/23 1445  BP: 128/82  Pulse: 64  SpO2: 98%   Vitals:   02/14/23 1445  Weight: 174 lb (78.9 kg)  Height: 5' 4"$  (1.626 m)   Body mass index is 29.87 kg/m.  No results found.   Independent interpretation of notes and tests performed by another provider:   None  Procedures performed:   None  Pertinent History, Exam, Impression, and Recommendations:   Jeanette Townsend was seen today for nasal congestion.  Acute bacterial rhinosinusitis Assessment & Plan: 10- 12-day history of fluctuating symptomatology involving congestion, left greater than right sinus pressure, scratchy throat, intermittently productive cough.  Has been aggressively treating this with OTC medication, denies fevers, chills, body aches, shortness of air.  Examination with allergic shiners bilaterally, tenderness about the left ethmoid maximally, erythematous turbinates bilaterally with mild swelling, left greater than right, tympanic membranes and canals benign bilaterally, oropharynx benign, no significant lymphadenopathy, lung fields are clear to auscultation bilaterally without wheezes, rales, rhonchi, benign cardiac sounds.  Given the duration of symptomatology despite treatments to date, findings on exam, concern for acute bacterial rhinosinusitis with left neck swelling, plan for Augmentin, she can continue OTC regimen for supportive care and contact us for any persistent symptomatology.  If noted can consider escalation of antibiotic regimen, testing for additional viral illnesses can be considered though she is past the  window for treatment, if shortness of air noted chest x-ray to be considered.  Orders: -     Amoxicillin-Pot Clavulanate; Take 1 tablet by mouth 2 (two) times daily.  Dispense: 14 tablet; Refill: 0     Orders & Medications Meds ordered this encounter  Medications   amoxicillin-clavulanate (AUGMENTIN) 875-125 MG tablet    Sig: Take 1 tablet by mouth 2 (two) times daily.    Dispense:  14 tablet    Refill:  0   No orders of the defined types were placed in this encounter.    No follow-ups on file.     Montel Culver, MD, [facility]   Primary Care Sports Medicine Primary Care and Sports Medicine at [facility]

## 2023-02-14 NOTE — Patient Instructions (Signed)
-   Take antibiotics for full course - Continue over-the-counter medications while symptomatic and on antibiotics - Contact us for any persistent symptoms without improvement after antibiotics - Keep follow-up as scheduled

## 2023-02-14 NOTE — Assessment & Plan Note (Signed)
10- 12-day history of fluctuating symptomatology involving congestion, left greater than right sinus pressure, scratchy throat, intermittently productive cough.  Has been aggressively treating this with OTC medication, denies fevers, chills, body aches, shortness of air.  Examination with allergic shiners bilaterally, tenderness about the left ethmoid maximally, erythematous turbinates bilaterally with mild swelling, left greater than right, tympanic membranes and canals benign bilaterally, oropharynx benign, no significant lymphadenopathy, lung fields are clear to auscultation bilaterally without wheezes, rales, rhonchi, benign cardiac sounds.  Given the duration of symptomatology despite treatments to date, findings on exam, concern for acute bacterial rhinosinusitis with left neck swelling, plan for Augmentin, she can continue OTC regimen for supportive care and contact us for any persistent symptomatology.  If noted can consider escalation of antibiotic regimen, testing for additional viral illnesses can be considered though she is past the window for treatment, if shortness of air noted chest x-ray to be considered.

## 2023-03-29 ENCOUNTER — Other Ambulatory Visit: Payer: Self-pay | Admitting: Family Medicine

## 2023-03-29 DIAGNOSIS — Z1231 Encounter for screening mammogram for malignant neoplasm of breast: Secondary | ICD-10-CM

## 2023-04-13 ENCOUNTER — Encounter: Payer: Self-pay | Admitting: Internal Medicine

## 2023-04-24 ENCOUNTER — Ambulatory Visit (INDEPENDENT_AMBULATORY_CARE_PROVIDER_SITE_OTHER): Payer: No Typology Code available for payment source | Admitting: Family Medicine

## 2023-04-24 ENCOUNTER — Ambulatory Visit: Payer: Self-pay | Admitting: *Deleted

## 2023-04-24 VITALS — BP 138/88 | HR 80 | Ht 64.0 in | Wt 173.0 lb

## 2023-04-24 DIAGNOSIS — M67431 Ganglion, right wrist: Secondary | ICD-10-CM

## 2023-04-24 NOTE — Telephone Encounter (Signed)
  Chief Complaint: pea sized round ball like lump on inner wrist that came up 2 weeks ago Symptoms: see above Frequency: 2 weeks Pertinent Negatives: Patient denies N/A Disposition: [] ED /[] Urgent Care (no appt availability in office) / [x] Appointment(In office/virtual)/ []  [place] [facility]/ [] Home Care/ [] Refused Recommended Disposition /[] [place] [facility]/ []  Follow-up with PCP Additional Notes: Has an appt. With Dr. Ashley Royalty at 11:00 this morning so a complete triage was not needed.

## 2023-04-24 NOTE — Assessment & Plan Note (Signed)
Right-hand-dominant patient presenting with roughly 1 month history of right radial wrist pain and 1 week history of painful bump.  Examination with roughly 1 x 1 mm mobile cystic mass consistent with ganglion on at the right radial styloid, negative Finkelstein's, negative Phalen's, negative Tinel's, sensorimotor preserved, resisted motions benign  Findings most consistent with radial ganglion cyst, symptomatic. - Thumb spica brace x 2 weeks - Voltaren gel 2-4 times daily x 2 weeks then as needed - Can contact us at the 2-week mark or beyond for persistent symptoms at which point we can consider aspiration

## 2023-04-24 NOTE — Patient Instructions (Addendum)
-   Use brace at all times (okay to remove for handwashing, eating, sleeping only) x 2 weeks - Use topical anti-inflammatory Voltaren gel (diclofenac 1%) 2-4 times daily x 2 weeks then as needed - Contact our office at the 2-week mark or beyond for any persistent symptoms to discuss next steps

## 2023-04-24 NOTE — Telephone Encounter (Signed)
Message from Randol Kern sent at 04/24/2023  8:14 AM EDT  Summary: Swelling   Swelling, pain, inflammation in right wrist. Has appt today          Call History   Type Contact Phone/Fax User  04/24/2023 08:13 AM EDT Phone (Incoming) Earlyne Iba (Self) [phone] Rexene Edison) [facility]   Message from Randol Kern sent at 04/24/2023  8:14 AM EDT  Summary: Swelling   Swelling, pain, inflammation in right wrist. Has appt today          Call History   Type Contact Phone/Fax User  04/24/2023 08:13 AM EDT Phone (Incoming) Earlyne Iba (Self) [phone] Rexene Edison) [facility]    Reason for Disposition  Small firm or hard BALL OR LUMP    Has an appt. Today at 11:00 with Dr Ashley Royalty  Answer Assessment - Initial Assessment Questions 1. ONSET: "When did the swelling start?" (e.g., minutes, hours, days, weeks)    Pt has an appt. This morning with Dr. Ashley Royalty.   Had a message to give her a call.    I have a ball on the inside of the wrist on right side of my wrist.   I'm using ice.  I'm using a splint.   2. LOCATION: "What part of the wrist is swollen?"  "Are both wrists swollen or just one wrist?"     Right wrist with a pea sized ball that came up 2 weeks ago.  At this point I did not continue the triage since she has an appt. In 2 hours with Dr Ashley Royalty.  3. SEVERITY: "How bad is the swelling?"    * NONE: No joint swelling.   * SKIN ONLY: Localized; small area of puffy or swollen skin.   * BALL OR LUMP: There is a small firm ball or lump; size of a pea, marble, or grape.   * MILD: Joint looks or feels mildly swollen or puffy.   * MODERATE: Swollen; interferes with normal activities (e.g., work or school); decreased range of movement.   * SEVERE: Very swollen; can't move swollen joint at all; unable to hold a cup of water.     A pea sized ball on right inner wrist that came up 2 weeks ago, 4. RECURRENT SYMPTOM: "Have you had wrist swelling before?" If Yes, ask: "When was  the last time?" "What happened that time?"     Not asked as has an appt. In 2 hrs with Dr Ashley Royalty 5. CAUSE: "What do you think is causing the wrist swelling?" (e.g., arthritis, ganglion cyst, insect bite, recent injury)     Don't know 6. OTHER SYMPTOMS: "Do you have any other symptoms?" (e.g., fever, hand pain)     Not asked 7. PREGNANCY: "Is there any chance you are pregnant?" "When was your last menstrual period?"     Not asked  Protocols used: Wrist Swelling-A-AH

## 2023-04-24 NOTE — Progress Notes (Signed)
     Primary Care / Sports Medicine Office Visit  Patient Information:  Patient ID: Jeanette Townsend, female DOB: February 10, 1971 Age: 52 y.o. MRN: [id number]   Jeanette Townsend is a pleasant 52 y.o. female presenting with the following:  Chief Complaint  Patient presents with   Cyst    Wrist right, 1 week, painful    Vitals:   04/24/23 1052  BP: 138/88  Pulse: 80  SpO2: 98%   Vitals:   04/24/23 1052  Weight: 173 lb (78.5 kg)  Height: 5\' 4"  (1.626 m)   Body mass index is 29.7 kg/m.  No results found.   Independent interpretation of notes and tests performed by another provider:   None  Procedures performed:   None  Pertinent History, Exam, Impression, and Recommendations:   Jeanette Townsend was seen today for cyst.  Ganglion cyst of wrist, right Assessment & Plan: Right-hand-dominant patient presenting with roughly 1 month history of right radial wrist pain and 1 week history of painful bump.  Examination with roughly 1 x 1 mm mobile cystic mass consistent with ganglion on at the right radial styloid, negative Finkelstein's, negative Phalen's, negative Tinel's, sensorimotor preserved, resisted motions benign  Findings most consistent with radial ganglion cyst, symptomatic. - Thumb spica brace x 2 weeks - Voltaren gel 2-4 times daily x 2 weeks then as needed - Can contact us at the 2-week mark or beyond for persistent symptoms at which point we can consider aspiration      Orders & Medications No orders of the defined types were placed in this encounter.  No orders of the defined types were placed in this encounter.    No follow-ups on file.     Jerrol Banana, MD, [facility]   Primary Care Sports Medicine Primary Care and Sports Medicine at [facility]

## 2023-06-02 ENCOUNTER — Ambulatory Visit (INDEPENDENT_AMBULATORY_CARE_PROVIDER_SITE_OTHER): Payer: No Typology Code available for payment source | Admitting: Family Medicine

## 2023-06-02 ENCOUNTER — Encounter: Payer: Self-pay | Admitting: Family Medicine

## 2023-06-02 VITALS — BP 124/82 | HR 86 | Ht 64.0 in | Wt 179.0 lb

## 2023-06-02 DIAGNOSIS — Z1159 Encounter for screening for other viral diseases: Secondary | ICD-10-CM

## 2023-06-02 DIAGNOSIS — Z114 Encounter for screening for human immunodeficiency virus [HIV]: Secondary | ICD-10-CM

## 2023-06-02 DIAGNOSIS — Z0000 Encounter for general adult medical examination without abnormal findings: Secondary | ICD-10-CM

## 2023-06-02 DIAGNOSIS — D500 Iron deficiency anemia secondary to blood loss (chronic): Secondary | ICD-10-CM

## 2023-06-02 DIAGNOSIS — E559 Vitamin D deficiency, unspecified: Secondary | ICD-10-CM

## 2023-06-02 DIAGNOSIS — M722 Plantar fascial fibromatosis: Secondary | ICD-10-CM

## 2023-06-02 DIAGNOSIS — I10 Essential (primary) hypertension: Secondary | ICD-10-CM

## 2023-06-02 DIAGNOSIS — Z13220 Encounter for screening for lipoid disorders: Secondary | ICD-10-CM

## 2023-06-02 NOTE — Progress Notes (Signed)
Annual Physical Exam Visit  Patient Information:  Patient ID: Jeanette Townsend, female DOB: November 30, 1971 Age: 52 y.o. MRN: [id number]   Subjective:   CC: Annual Physical Exam  HPI:  Jeanette Townsend is here for their annual physical.  I reviewed the past medical history, family history, social history, surgical history, and allergies today and changes were made as necessary.  Please see the problem list section below for additional details.  Past Medical History: Past Medical History:  Diagnosis Date   Allergy    Anemia    Asthma    childhood   Excessive vaginal bleeding    Fibroids    Past Surgical History: Past Surgical History:  Procedure Laterality Date   COLONOSCOPY WITH PROPOFOL N/A 05/06/2021   Procedure: COLONOSCOPY WITH PROPOFOL;  Surgeon: Midge Minium, MD;  Location: [facility];  Service: Endoscopy;  Laterality: N/A;   DILATATION & CURETTAGE/HYSTEROSCOPY WITH MYOSURE N/A 05/26/2016   Procedure: DILATATION & CURETTAGE/HYSTEROSCOPY WITH MYOSURE RESECTION OF SUBMUCOSAL FIBROID;  Surgeon: Vena Austria, MD;  Location: [facility];  Service: Gynecology;  Laterality: N/A;   DILATION AND CURETTAGE OF UTERUS  08/2011   HYSTEROSCOPY WITH D & C N/A 01/09/2020   Procedure: DILATATION AND CURETTAGE /HYSTEROSCOPY;  Surgeon: Vena Austria, MD;  Location: [facility];  Service: Gynecology;  Laterality: N/A;   INTRAUTERINE DEVICE (IUD) INSERTION N/A 01/09/2020   Procedure: INTRAUTERINE DEVICE (IUD) INSERTION;  Surgeon: Vena Austria, MD;  Location: [facility];  Service: Gynecology;  Laterality: N/A;   POLYPECTOMY  05/06/2021   Procedure: POLYPECTOMY;  Surgeon: Midge Minium, MD;  Location: [facility];  Service: Endoscopy;;   Family History: Family History  Problem Relation Age of Onset   Hypertension Mother    Diverticulitis Mother    Hypertension Father    Breast cancer Neg Hx    Allergies: No Known Allergies Health Maintenance: Health Maintenance  Topic Date Due    HIV Screening  Never done   Hepatitis C Screening  Never done   INFLUENZA VACCINE  07/27/2023   PAP SMEAR-Modifier  06/21/2025   Colonoscopy  05/06/2028   HPV VACCINES  Aged Out   DTaP/Tdap/Td  Discontinued   COVID-19 Vaccine  Discontinued    HM Colonoscopy          Colonoscopy (Every 7 Years) Next due on 05/06/2028    05/06/2021  COLONOSCOPY   Only the first 1 history entries have been loaded, but more history exists.           Medications: Current Outpatient Medications on File Prior to Visit  Medication Sig Dispense Refill   amLODipine (NORVASC) 5 MG tablet Take 1 tablet (5 mg total) by mouth daily. 90 tablet 1   levonorgestrel (MIRENA) 20 MCG/DAY IUD 1 each by Intrauterine route once.     No current facility-administered medications on file prior to visit.    Review of Systems: No headache, visual changes, nausea, vomiting, diarrhea, constipation, dizziness, abdominal pain, skin rash, fevers, chills, night sweats, swollen lymph nodes, weight loss, chest pain, body aches, joint swelling, muscle aches, shortness of breath, mood changes, visual or auditory hallucinations reported.  Objective:   Vitals:   06/02/23 1324  BP: 124/82  Pulse: 86  SpO2: 96%   Vitals:   06/02/23 1324  Weight: 179 lb (81.2 kg)  Height: 5\' 4"  (1.626 m)   Body mass index is 30.73 kg/m.  General: Well Developed, well nourished, and in no acute distress.  Neuro: Alert and oriented x3, extra-ocular  muscles intact, sensation grossly intact. Cranial nerves II through XII are grossly intact, motor, sensory, and coordinative functions are intact. HEENT: Normocephalic, atraumatic, pupils equal round reactive to light, neck supple, no masses, no lymphadenopathy, thyroid nonpalpable. Oropharynx, nasopharynx, external ear canals are unremarkable. Skin: Warm and dry, no rashes noted.  Cardiac: Regular rate and rhythm, no murmurs rubs or gallops. No peripheral edema. Pulses symmetric. Respiratory:  Clear to auscultation bilaterally. Not using accessory muscles, speaking in full sentences.  Abdominal: Soft, nontender, nondistended, positive bowel sounds, no masses, no organomegaly. Musculoskeletal: Shoulder, elbow, wrist, hip, knee, ankle stable, and with full range of motion.  Female chaperone initials: KP present throughout the physical examination.  Impression and Recommendations:   The patient was counselled, risk factors were discussed, and anticipatory guidance given.  Problem List Items Addressed This Visit       Cardiovascular and Mediastinum   Hypertension    Chronic, without cardiopulmonary complaints, controlled on current regimen. - Continue amlodipine 5 mg tablet daily - Updated risk stratification labs ordered today - Continue healthy lifestyle changes      Relevant Orders   CBC   Comprehensive metabolic panel   Lipid panel   TSH   Apo A1 + B + Ratio     Musculoskeletal and Integument   Plantar fasciitis, left    - Home exercises reprinted        Other   Iron deficiency anemia due to chronic blood loss    - Updated labs to evaluate condition ordered      Relevant Orders   CBC   Iron, TIBC and Ferritin Panel   Healthcare maintenance - Primary    Annual examination completed, risk stratification labs ordered, anticipatory guidance provided.  We will follow labs once resulted.      Relevant Orders   CBC   Comprehensive metabolic panel   Hepatitis C antibody   HIV Antibody (routine testing w rflx)   Lipid panel   TSH   VITAMIN D 25 Hydroxy (Vit-D Deficiency, Fractures)   Apo A1 + B + Ratio   Iron, TIBC and Ferritin Panel   Other Visit Diagnoses     Screening for HIV (human immunodeficiency virus)       Relevant Orders   HIV Antibody (routine testing w rflx)   Need for hepatitis C screening test       Relevant Orders   Hepatitis C antibody   Annual physical exam       Relevant Orders   CBC   Comprehensive metabolic panel   Hepatitis C  antibody   HIV Antibody (routine testing w rflx)   Lipid panel   TSH   VITAMIN D 25 Hydroxy (Vit-D Deficiency, Fractures)   Apo A1 + B + Ratio   Iron, TIBC and Ferritin Panel   Vitamin D deficiency       Relevant Orders   VITAMIN D 25 Hydroxy (Vit-D Deficiency, Fractures)   Screening for lipoid disorders       Relevant Orders   Comprehensive metabolic panel   Lipid panel   Apo A1 + B + Ratio        Orders & Medications Medications: No orders of the defined types were placed in this encounter.  Orders Placed This Encounter  Procedures   CBC   Comprehensive metabolic panel   Hepatitis C antibody   HIV Antibody (routine testing w rflx)   Lipid panel   TSH   VITAMIN D 25 Hydroxy (Vit-D Deficiency, Fractures)  Apo A1 + B + Ratio   Iron, TIBC and Ferritin Panel     Return in about 6 months (around 12/02/2023) for bp.    Jerrol Banana, MD, [facility]   Primary Care Sports Medicine Primary Care and Sports Medicine at [facility]

## 2023-06-02 NOTE — Patient Instructions (Signed)
-   Obtain fasting labs with orders provided (can have water or black coffee but otherwise no food or drink x 8 hours before labs) - Review information provided - Attend eye doctor annually, dentist every 6 months, work towards or maintain 30 minutes of moderate intensity physical activity at least 5 days per week, and consume a balanced diet - Return in 6 months for follow-up - Contact us for any questions between now and then  Additionally: - Incorporate home exercises program handouts provided today - Contact us for any worsening knee/foot/ankle symptoms to coordinate next steps

## 2023-06-02 NOTE — Assessment & Plan Note (Signed)
-   Updated labs to evaluate condition ordered

## 2023-06-02 NOTE — Assessment & Plan Note (Signed)
-   Home exercises reprinted

## 2023-06-02 NOTE — Assessment & Plan Note (Signed)
Annual examination completed, risk stratification labs ordered, anticipatory guidance provided.  We will follow labs once resulted. 

## 2023-06-02 NOTE — Assessment & Plan Note (Signed)
Chronic, without cardiopulmonary complaints, controlled on current regimen. - Continue amlodipine 5 mg tablet daily - Updated risk stratification labs ordered today - Continue healthy lifestyle changes

## 2023-06-10 LAB — COMPREHENSIVE METABOLIC PANEL
ALT: 21 IU/L (ref 0–32)
AST: 20 IU/L (ref 0–40)
Albumin/Globulin Ratio: 1.6
Albumin: 4.6 g/dL (ref 3.9–4.9)
Alkaline Phosphatase: 85 IU/L (ref 44–121)
BUN/Creatinine Ratio: 17 (ref 9–23)
BUN: 11 mg/dL (ref 6–24)
Bilirubin Total: 0.6 mg/dL (ref 0.0–1.2)
CO2: 23 mmol/L (ref 20–29)
Calcium: 9.7 mg/dL (ref 8.7–10.2)
Chloride: 100 mmol/L (ref 96–106)
Creatinine, Ser: 0.65 mg/dL (ref 0.57–1.00)
Globulin, Total: 2.8 g/dL (ref 1.5–4.5)
Glucose: 103 mg/dL — ABNORMAL HIGH (ref 70–99)
Potassium: 4.5 mmol/L (ref 3.5–5.2)
Sodium: 138 mmol/L (ref 134–144)
Total Protein: 7.4 g/dL (ref 6.0–8.5)
eGFR: 108 mL/min/{1.73_m2} (ref >59)

## 2023-06-10 LAB — VITAMIN D 25 HYDROXY (VIT D DEFICIENCY, FRACTURES): Vit D, 25-Hydroxy: 11.7 ng/mL — ABNORMAL LOW (ref 30.0–100.0)

## 2023-06-10 LAB — APO A1 + B + RATIO
Apolipo. B/A-1 Ratio: 0.8 ratio — ABNORMAL HIGH (ref 0.0–0.6)
Apolipoprotein A-1: 144 mg/dL (ref 116–209)
Apolipoprotein B: 121 mg/dL — ABNORMAL HIGH (ref <90)

## 2023-06-10 LAB — IRON,TIBC AND FERRITIN PANEL
Ferritin: 32 ng/mL (ref 15–150)
Iron Saturation: 35 % (ref 15–55)
Iron: 144 ug/dL (ref 27–159)
Total Iron Binding Capacity: 414 ug/dL (ref 250–450)
UIBC: 270 ug/dL (ref 131–425)

## 2023-06-10 LAB — CBC
Hematocrit: 44.5 % (ref 34.0–46.6)
Hemoglobin: 14.5 g/dL (ref 11.1–15.9)
MCH: 29 pg (ref 26.6–33.0)
MCHC: 32.6 g/dL (ref 31.5–35.7)
MCV: 89 fL (ref 79–97)
Platelets: 317 10*3/uL (ref 150–450)
RBC: 5 x10E6/uL (ref 3.77–5.28)
RDW: 12.8 % (ref 11.7–15.4)
WBC: 7.8 10*3/uL (ref 3.4–10.8)

## 2023-06-10 LAB — HEPATITIS C ANTIBODY: Hep C Virus Ab: NONREACTIVE

## 2023-06-10 LAB — TSH: TSH: 1.8 u[IU]/mL (ref 0.450–4.500)

## 2023-06-10 LAB — LIPID PANEL
Chol/HDL Ratio: 4.3 ratio (ref 0.0–4.4)
Cholesterol, Total: 221 mg/dL — ABNORMAL HIGH (ref 100–199)
HDL: 51 mg/dL (ref >39)
LDL Chol Calc (NIH): 143 mg/dL — ABNORMAL HIGH (ref 0–99)
Triglycerides: 152 mg/dL — ABNORMAL HIGH (ref 0–149)
VLDL Cholesterol Cal: 27 mg/dL (ref 5–40)

## 2023-06-10 LAB — HIV ANTIBODY (ROUTINE TESTING W REFLEX): HIV Screen 4th Generation wRfx: NONREACTIVE

## 2023-06-12 ENCOUNTER — Other Ambulatory Visit: Payer: Self-pay | Admitting: Family Medicine

## 2023-06-12 MED ORDER — VITAMIN D (ERGOCALCIFEROL) 1.25 MG (50000 UNIT) PO CAPS: 50000.0000 [IU] | ORAL_CAPSULE | ORAL | 0 refills | Status: DC

## 2023-06-15 ENCOUNTER — Encounter: Payer: Self-pay | Admitting: Family Medicine

## 2023-06-15 LAB — HGB A1C W/O EAG: Hgb A1c MFr Bld: 6 % — ABNORMAL HIGH (ref 4.8–5.6)

## 2023-06-15 LAB — SPECIMEN STATUS REPORT

## 2023-07-17 ENCOUNTER — Ambulatory Visit
Admission: RE | Admit: 2023-07-17 | Discharge: 2023-07-17 | Disposition: A | Discharge status: Home / Self Care | Payer: No Typology Code available for payment source | Source: Ambulatory Visit | Attending: Family Medicine | Admitting: Family Medicine

## 2023-07-17 DIAGNOSIS — Z1231 Encounter for screening mammogram for malignant neoplasm of breast: Secondary | ICD-10-CM | POA: Insufficient documentation

## 2023-12-01 ENCOUNTER — Encounter: Payer: Self-pay | Admitting: Internal Medicine

## 2023-12-01 ENCOUNTER — Encounter: Payer: Self-pay | Admitting: Family Medicine

## 2023-12-01 ENCOUNTER — Ambulatory Visit: Payer: No Typology Code available for payment source | Admitting: Family Medicine

## 2023-12-01 VITALS — BP 118/76 | HR 88 | Ht 64.0 in | Wt 178.8 lb

## 2023-12-01 DIAGNOSIS — E559 Vitamin D deficiency, unspecified: Secondary | ICD-10-CM | POA: Diagnosis not present

## 2023-12-01 DIAGNOSIS — I10 Essential (primary) hypertension: Secondary | ICD-10-CM | POA: Diagnosis not present

## 2023-12-01 DIAGNOSIS — M722 Plantar fascial fibromatosis: Secondary | ICD-10-CM | POA: Diagnosis not present

## 2023-12-01 MED ORDER — AMLODIPINE BESYLATE 5 MG PO TABS: 5.0000 mg | ORAL_TABLET | Freq: Every day | ORAL | 3 refills | Status: DC

## 2023-12-01 NOTE — Progress Notes (Signed)
     Primary Care / Sports Medicine Office Visit  Patient Information:  Patient ID: Jeanette Townsend, female DOB: 1971-12-09 Age: 52 y.o. MRN: [id number]   Jeanette Townsend is a pleasant 52 y.o. female presenting with the following:  Chief Complaint  Patient presents with   Hypertension    Patient here for 6 month follow up on her blood pressure. Patient has been taking her b/p medication daily.     Vitals:   12/01/23 1340  BP: 118/76  Pulse: 88  SpO2: 98%   Vitals:   12/01/23 1340  Weight: 178 lb 12.8 oz (81.1 kg)  Height: 5\' 4"  (1.626 m)   Body mass index is 30.69 kg/m.  No results found.   Independent interpretation of notes and tests performed by another provider:   None  Procedures performed:   None  Pertinent History, Exam, Impression, and Recommendations:   Problem List Items Addressed This Visit       Cardiovascular and Mediastinum   Hypertension - Primary    Jeanette Townsend, a 52 year old patient with a history of hypertension, presents for a routine follow-up. She reports that her blood pressure has been well-managed with amlodipine, and she has not experienced any adverse effects from the medication.   Hypertension Well controlled on Amlodipine 5mg  daily. No symptoms of hypotension reported. -Continue Amlodipine 5mg  daily. -Refill Amlodipine prescription for 1 year.      Relevant Medications   amLODipine (NORVASC) 5 MG tablet     Musculoskeletal and Integument   Plantar fasciitis, left    Jeanette Townsend also has a heel spur, which occasionally causes discomfort, particularly after prolonged periods of walking. She uses orthotic supports and takes ibuprofen as needed for pain management. She also reports some discomfort in her wrist and knee, which she manages with supports and occasional ibuprofen. She has been receiving acupuncture treatments from her chiropractor, which she believes have been beneficial.  Musculoskeletal discomfort Patient reports occasional  discomfort in shoulder and heel spur. Currently managing with ibuprofen as needed and attending chiropractic sessions. -Continue current management strategies. -Consider cortisone injections if symptoms worsen. -Provide patient with exercises to improve flexibility and reduce discomfort.        Other   Vitamin D deficiency    Vitamin D deficiency Patient reports occasional stomach discomfort after taking over-the-counter Vitamin D supplement. -Continue over-the-counter Vitamin D supplement, consider taking at dinner time to minimize discomfort. -Check Vitamin D levels at next lab appointment in June.      I provided a total time of 46 minutes including both face-to-face and non-face-to-face time on 12/01/2023 inclusive of time utilized for medical chart review, information gathering, care coordination with staff, and documentation completion.   Orders & Medications Medications:  Meds ordered this encounter  Medications   amLODipine (NORVASC) 5 MG tablet    Sig: Take 1 tablet (5 mg total) by mouth daily.    Dispense:  90 tablet    Refill:  3   No orders of the defined types were placed in this encounter.    Return in about 6 months (around 06/02/2024) for CPE.     Jerrol Banana, MD, [facility]   Primary Care Sports Medicine Primary Care and Sports Medicine at [facility]

## 2023-12-01 NOTE — Patient Instructions (Signed)
YOUR PLAN:  -HYPERTENSION: Your blood pressure is well-controlled with Amlodipine 5mg  daily, and you should continue this medication. We have refilled your prescription for another year.  -VITAMIN D DEFICIENCY: You mentioned occasional stomach discomfort after taking your supplement. Try taking it at dinner time to minimize this. We will check your Vitamin D levels at your next lab appointment in June.  -MUSCULOSKELETAL DISCOMFORT: You are managing your heel spur and other discomforts with ibuprofen and chiropractic sessions. Continue these strategies, and consider cortisone injections if the pain worsens. We will also provide you with exercises to help improve flexibility and reduce discomfort.  INSTRUCTIONS:  Please schedule your annual physical for June. At your next lab appointment in June, we will check your cholesterol and Vitamin D levels. Also, remember to discuss potential hormonal changes with your gynecologist at your next appointment.

## 2023-12-01 NOTE — Assessment & Plan Note (Signed)
Jeanette Townsend, a 52 year old patient with a history of hypertension, presents for a routine follow-up. She reports that her blood pressure has been well-managed with amlodipine, and she has not experienced any adverse effects from the medication.   Hypertension Well controlled on Amlodipine 5mg  daily. No symptoms of hypotension reported. -Continue Amlodipine 5mg  daily. -Refill Amlodipine prescription for 1 year.

## 2023-12-01 NOTE — Assessment & Plan Note (Signed)
Vitamin D deficiency Patient reports occasional stomach discomfort after taking over-the-counter Vitamin D supplement. -Continue over-the-counter Vitamin D supplement, consider taking at dinner time to minimize discomfort. -Check Vitamin D levels at next lab appointment in June.

## 2023-12-01 NOTE — Assessment & Plan Note (Signed)
Ms. Goepfert also has a heel spur, which occasionally causes discomfort, particularly after prolonged periods of walking. She uses orthotic supports and takes ibuprofen as needed for pain management. She also reports some discomfort in her wrist and knee, which she manages with supports and occasional ibuprofen. She has been receiving acupuncture treatments from her chiropractor, which she believes have been beneficial.  Musculoskeletal discomfort Patient reports occasional discomfort in shoulder and heel spur. Currently managing with ibuprofen as needed and attending chiropractic sessions. -Continue current management strategies. -Consider cortisone injections if symptoms worsen. -Provide patient with exercises to improve flexibility and reduce discomfort.

## 2023-12-04 ENCOUNTER — Telehealth: Payer: Self-pay | Admitting: Family Medicine

## 2023-12-04 NOTE — Telephone Encounter (Signed)
Pt is calling to follow up if hand exercises discussed at 12/01/23 office visit can be uploaded into her Mychart. Please advise CB- [phone]

## 2023-12-04 NOTE — Telephone Encounter (Signed)
Please review.  KP

## 2023-12-05 ENCOUNTER — Encounter: Payer: Self-pay | Admitting: Family Medicine

## 2023-12-05 NOTE — Telephone Encounter (Signed)
Noted  KP 

## 2023-12-12 NOTE — Final Progress Note (Signed)
Updated.

## 2023-12-15 ENCOUNTER — Encounter: Payer: Self-pay | Admitting: Internal Medicine

## 2024-02-10 ENCOUNTER — Ambulatory Visit
Admission: EM | Admit: 2024-02-10 | Discharge: 2024-02-10 | Disposition: A | Discharge status: Home / Self Care | Payer: PRIVATE HEALTH INSURANCE | Attending: Physician Assistant | Admitting: Physician Assistant

## 2024-02-10 ENCOUNTER — Encounter: Payer: Self-pay | Admitting: Internal Medicine

## 2024-02-10 ENCOUNTER — Encounter: Payer: Self-pay | Admitting: Emergency Medicine

## 2024-02-10 DIAGNOSIS — N764 Abscess of vulva: Secondary | ICD-10-CM

## 2024-02-10 MED ORDER — DOXYCYCLINE HYCLATE 100 MG PO CAPS: 100.0000 mg | ORAL_CAPSULE | Freq: Two times a day (BID) | ORAL | 0 refills | Status: AC

## 2024-02-10 NOTE — ED Triage Notes (Signed)
 Patient c/o reports abscess on the left side of her labia on Thursday.  Patient reports some drainage from the area yesterday.  Patient reports the area has gotten bigger.

## 2024-02-10 NOTE — ED Provider Notes (Signed)
 [facility]    CSN: [id number] Arrival date & time: 02/10/24  0845      History   Chief Complaint Chief Complaint  Patient presents with   Abscess    HPI Jeanette Townsend is a 53 y.o. female presenting for a small abscess to the left labia for the past 3 days.  Patient says it started as a small bump.  She states she squeezed some pus out and then it filled back up.  She reports is gotten larger.  She has also tried warm compresses.  Denies similar problems in the past.  Denies history of MRSA.  No associated fever.  Area continues to drain.  No other complaints.  HPI  Past Medical History:  Diagnosis Date   Allergy    Anemia    Asthma    childhood   Excessive vaginal bleeding    Fibroids     Patient Active Problem List   Diagnosis Date Noted   Vitamin D deficiency 12/01/2023   Healthcare maintenance 06/02/2023   Ganglion cyst of wrist, right 04/24/2023   Anxiety 07/27/2022   UTI (urinary tract infection) 06/15/2022   Hypertension 05/30/2022   Plantar fasciitis, left 05/30/2022   Encounter for screening colonoscopy    Polyp of colon    Iron deficiency anemia due to chronic blood loss 04/28/2016   Fibroid 04/21/2016   Allergic rhinitis 08/14/2007   Asthma 08/14/2007    Past Surgical History:  Procedure Laterality Date   COLONOSCOPY WITH PROPOFOL N/A 05/06/2021   Procedure: COLONOSCOPY WITH PROPOFOL;  Surgeon: Midge Minium, MD;  Location: [facility];  Service: Endoscopy;  Laterality: N/A;   DILATATION & CURETTAGE/HYSTEROSCOPY WITH MYOSURE N/A 05/26/2016   Procedure: DILATATION & CURETTAGE/HYSTEROSCOPY WITH MYOSURE RESECTION OF SUBMUCOSAL FIBROID;  Surgeon: Vena Austria, MD;  Location: [facility];  Service: Gynecology;  Laterality: N/A;   DILATION AND CURETTAGE OF UTERUS  08/2011   HYSTEROSCOPY WITH D & C N/A 01/09/2020   Procedure: DILATATION AND CURETTAGE /HYSTEROSCOPY;  Surgeon: Vena Austria, MD;  Location: [facility];  Service: Gynecology;   Laterality: N/A;   INTRAUTERINE DEVICE (IUD) INSERTION N/A 01/09/2020   Procedure: INTRAUTERINE DEVICE (IUD) INSERTION;  Surgeon: Vena Austria, MD;  Location: [facility];  Service: Gynecology;  Laterality: N/A;   POLYPECTOMY  05/06/2021   Procedure: POLYPECTOMY;  Surgeon: Midge Minium, MD;  Location: [facility];  Service: Endoscopy;;    OB History     Gravida  0   Para  0   Term  0   Preterm  0   AB  0   Living  0      SAB  0   IAB  0   Ectopic  0   Multiple  0   Live Births  0            Home Medications    Prior to Admission medications   Medication Sig Start Date End Date Taking? Authorizing Provider  amLODipine (NORVASC) 5 MG tablet Take 1 tablet (5 mg total) by mouth daily. 12/01/23  Yes Jerrol Banana, MD  doxycycline (VIBRAMYCIN) 100 MG capsule Take 1 capsule (100 mg total) by mouth 2 (two) times daily for 7 days. 02/10/24 02/17/24 Yes Shirlee Latch, PA-C  levonorgestrel (MIRENA) 20 MCG/DAY IUD 1 each by Intrauterine route once.   Yes [provider]  calcium-vitamin D (OSCAL WITH D) 500-5 MG-MCG tablet Take 1 tablet by mouth daily with breakfast.    [provider]  Family History Family History  Problem Relation Age of Onset   Hypertension Mother    Diverticulitis Mother    Hypertension Father    Breast cancer Neg Hx     Social History Social History   Tobacco Use   Smoking status: Never   Smokeless tobacco: Never  Vaping Use   Vaping status: Never Used  Substance Use Topics   Alcohol use: No   Drug use: No     Allergies   Patient has no known allergies.   Review of Systems Review of Systems  Constitutional:  Negative for fatigue and fever.  Genitourinary:  Positive for vaginal pain. Negative for vaginal discharge.  Skin:  Positive for color change and wound.  Neurological:  Negative for weakness.     Physical Exam Triage Vital Signs ED Triage Vitals  Encounter Vitals Group     BP  02/10/24 1002 (!) 140/95     Systolic BP Percentile --      Diastolic BP Percentile --      Pulse Rate 02/10/24 1002 87     Resp 02/10/24 1002 14     Temp 02/10/24 1002 98.6 F (37 C)     Temp Source 02/10/24 1002 Oral     SpO2 02/10/24 1002 97 %     Weight 02/10/24 1001 178 lb 12.7 oz (81.1 kg)     Height 02/10/24 1001 5\' 4"  (1.626 m)     Head Circumference --      Peak Flow --      Pain Score 02/10/24 1001 7     Pain Loc --      Pain Education --      Exclude from Growth Chart --    No data found.  Updated Vital Signs BP (!) 140/95 (BP Location: Right Arm)   Pulse 87   Temp 98.6 F (37 C) (Oral)   Resp 14   Ht 5\' 4"  (1.626 m)   Wt 178 lb 12.7 oz (81.1 kg)   SpO2 97%   BMI 30.69 kg/m    Physical Exam Vitals and nursing note reviewed.  Constitutional:      General: She is not in acute distress.    Appearance: Normal appearance. She is not ill-appearing or toxic-appearing.  HENT:     Head: Normocephalic and atraumatic.  Eyes:     General: No scleral icterus.       Right eye: No discharge.        Left eye: No discharge.     Conjunctiva/sclera: Conjunctivae normal.  Cardiovascular:     Rate and Rhythm: Normal rate.  Pulmonary:     Effort: Pulmonary effort is normal. No respiratory distress.  Genitourinary:    Labia:        Left: Lesion (small abscess 1.5 cm x 1 cm with open center that is draining foul smelling purulent material and blood. Area is TTP.) present.     Musculoskeletal:     Cervical back: Neck supple.  Skin:    General: Skin is dry.  Neurological:     General: No focal deficit present.     Mental Status: She is alert. Mental status is at baseline.     Motor: No weakness.     Gait: Gait normal.  Psychiatric:        Mood and Affect: Mood normal.        Behavior: Behavior normal.      UC Treatments / Results  Labs (all labs ordered are listed,  but only abnormal results are displayed) Labs Reviewed - No data to  display  EKG   Radiology No results found.  Procedures Procedures (including critical care time)  Medications Ordered in UC Medications - No data to display  Initial Impression / Assessment and Plan / UC Course  I have reviewed the triage vital signs and the nursing notes.  Pertinent labs & imaging results that were available during my care of the patient were reviewed by me and considered in my medical decision making (see chart for details).   53 year old female presents for a small abscess of the left labia minora for the past couple of days.  She has applied warm compresses and squeezed pus out of the abscess.  It is still open and draining.  On exam she has a 1.5 cm x 1 cm area of induration, swelling and fluctuance with an open center that is draining foul-smelling yellowish purulent material and blood.  Area is very tender to palpation.  Since abscess is small and already open and draining on its own, will defer I&D.  Sent doxycycline to pharmacy encouraged her to take warm baths and continue with warm compresses.  Thoroughly reviewed return precautions.   Final Clinical Impressions(s) / UC Diagnoses   Final diagnoses:  Labial abscess     Discharge Instructions      -The abscess is already open and draining on its own.  This is a good thing.  Will continue to drain over the next few days. - Take hot baths a couple times a day and/or apply warm compresses to the area to help facilitate drainage. - I sent an antibiotic to the pharmacy for you to take over the next week.  If you have noticed an improvement in the next 2-3 days or symptoms worsen please return for evaluation.   ED Prescriptions     Medication Sig Dispense Auth. Provider   doxycycline (VIBRAMYCIN) 100 MG capsule Take 1 capsule (100 mg total) by mouth 2 (two) times daily for 7 days. 14 capsule Shirlee Latch, PA-C      PDMP not reviewed this encounter.   Shirlee Latch, PA-C 02/10/24 1045

## 2024-02-10 NOTE — Discharge Instructions (Addendum)
-  The abscess is already open and draining on its own.  This is a good thing.  Will continue to drain over the next few days. - Take hot baths a couple times a day and/or apply warm compresses to the area to help facilitate drainage. - I sent an antibiotic to the pharmacy for you to take over the next week.  If you have noticed an improvement in the next 2-3 days or symptoms worsen please return for evaluation.

## 2024-02-23 ENCOUNTER — Encounter: Payer: Self-pay | Admitting: Internal Medicine

## 2024-03-05 ENCOUNTER — Encounter: Payer: Self-pay | Admitting: Family Medicine

## 2024-03-05 NOTE — Telephone Encounter (Signed)
 Please review and advise. Thank you  JM

## 2024-06-03 ENCOUNTER — Ambulatory Visit (INDEPENDENT_AMBULATORY_CARE_PROVIDER_SITE_OTHER): Payer: Self-pay | Admitting: Family Medicine

## 2024-06-03 ENCOUNTER — Encounter: Payer: Self-pay | Admitting: Family Medicine

## 2024-06-03 ENCOUNTER — Encounter: Payer: Self-pay | Admitting: Internal Medicine

## 2024-06-03 VITALS — BP 118/74 | HR 85 | Ht 64.0 in | Wt 175.2 lb

## 2024-06-03 DIAGNOSIS — J301 Allergic rhinitis due to pollen: Secondary | ICD-10-CM

## 2024-06-03 DIAGNOSIS — Z0000 Encounter for general adult medical examination without abnormal findings: Secondary | ICD-10-CM | POA: Diagnosis not present

## 2024-06-03 DIAGNOSIS — K8020 Calculus of gallbladder without cholecystitis without obstruction: Secondary | ICD-10-CM | POA: Insufficient documentation

## 2024-06-03 DIAGNOSIS — M722 Plantar fascial fibromatosis: Secondary | ICD-10-CM

## 2024-06-03 DIAGNOSIS — R7309 Other abnormal glucose: Secondary | ICD-10-CM

## 2024-06-03 DIAGNOSIS — I10 Essential (primary) hypertension: Secondary | ICD-10-CM | POA: Diagnosis not present

## 2024-06-03 DIAGNOSIS — E559 Vitamin D deficiency, unspecified: Secondary | ICD-10-CM

## 2024-06-03 DIAGNOSIS — Z136 Encounter for screening for cardiovascular disorders: Secondary | ICD-10-CM

## 2024-06-03 DIAGNOSIS — D500 Iron deficiency anemia secondary to blood loss (chronic): Secondary | ICD-10-CM

## 2024-06-03 DIAGNOSIS — Z1231 Encounter for screening mammogram for malignant neoplasm of breast: Secondary | ICD-10-CM

## 2024-06-03 NOTE — Assessment & Plan Note (Signed)
 She experiences seasonal allergies, managed with over-the-counter Zyrtec and increased water  intake. She avoids outdoor activities when pollen levels are high, and her symptoms are currently well-controlled. She uses Pataday and Lumify eye drops for eye symptoms and has annual eye exams. She had asthma as a child and underwent allergy injections. Since moving to her current location 21 years ago, her allergies have worsened, particularly affecting her eyes.  Seasonal Allergic Rhinitis Symptoms controlled with Zyrtec; Flonase as backup. - Continue Zyrtec as needed. - Consider adding Flonase nasal spray if symptoms worsen.

## 2024-06-03 NOTE — Progress Notes (Signed)
 Annual Physical Exam Visit  Patient Information:  Patient ID: Jeanette Townsend, female DOB: Aug 31, 1971 Age: 53 y.o. MRN: [id number]   Subjective:   CC: Annual Physical Exam  HPI:  Jeanette Townsend is here for their annual physical.  I reviewed the past medical history, family history, social history, surgical history, and allergies today and changes were made as necessary.  Please see the problem list section below for additional details.  Past Medical History: Past Medical History:  Diagnosis Date   Allergy    Anemia    Asthma    childhood   Asthma 08/14/2007   Qualifier: Diagnosis of   By: Cherlyn Cornet MD, Amy      IMO SNOMED Dx Update Oct 2024     Excessive vaginal bleeding    Fibroids    Gallstone    Past Surgical History: Past Surgical History:  Procedure Laterality Date   COLONOSCOPY WITH PROPOFOL  N/A 05/06/2021   Procedure: COLONOSCOPY WITH PROPOFOL ;  Surgeon: Marnee Sink, MD;  Location: [facility];  Service: Endoscopy;  Laterality: N/A;   DILATATION & CURETTAGE/HYSTEROSCOPY WITH MYOSURE N/A 05/26/2016   Procedure: DILATATION & CURETTAGE/HYSTEROSCOPY WITH MYOSURE RESECTION OF SUBMUCOSAL FIBROID;  Surgeon: Darl Edu, MD;  Location: [facility];  Service: Gynecology;  Laterality: N/A;   DILATION AND CURETTAGE OF UTERUS  08/2011   HYSTEROSCOPY WITH D & C N/A 01/09/2020   Procedure: DILATATION AND CURETTAGE /HYSTEROSCOPY;  Surgeon: Darl Edu, MD;  Location: [facility];  Service: Gynecology;  Laterality: N/A;   INTRAUTERINE DEVICE (IUD) INSERTION N/A 01/09/2020   Procedure: INTRAUTERINE DEVICE (IUD) INSERTION;  Surgeon: Darl Edu, MD;  Location: [facility];  Service: Gynecology;  Laterality: N/A;   POLYPECTOMY  05/06/2021   Procedure: POLYPECTOMY;  Surgeon: Marnee Sink, MD;  Location: [facility];  Service: Endoscopy;;   Family History: Family History  Problem Relation Age of Onset   Hypertension Mother    Diverticulitis Mother    Hypertension  Father    Breast cancer Neg Hx    Allergies: No Known Allergies Health Maintenance: Health Maintenance  Topic Date Due   Zoster Vaccines- Shingrix (1 of 2) 09/03/2024 (Originally 07/10/2023)   Pneumococcal Vaccine 37-58 Years old (1 of 2 - PCV) 06/03/2025 (Originally 07/09/1992)   MAMMOGRAM  07/16/2025   Cervical Cancer Screening (HPV/Pap Cotest)  06/22/2027   Colonoscopy  05/06/2028   Hepatitis C Screening  Completed   HIV Screening  Completed   HPV VACCINES  Aged Out   Meningococcal B Vaccine  Aged Out   DTaP/Tdap/Td  Discontinued   INFLUENZA VACCINE  Discontinued   COVID-19 Vaccine  Discontinued    HM Colonoscopy          Upcoming     Colonoscopy (Every 7 Years) Next due on 05/06/2028    05/06/2021  COLONOSCOPY   Only the first 1 history entries have been loaded, but more history exists.               Medications: Current Outpatient Medications on File Prior to Visit  Medication Sig Dispense Refill   amLODipine  (NORVASC ) 5 MG tablet Take 1 tablet (5 mg total) by mouth daily. 90 tablet 3   levonorgestrel (MIRENA) 20 MCG/DAY IUD 1 each by Intrauterine route once.     No current facility-administered medications on file prior to visit.    Objective:   Vitals:   06/03/24 1012  BP: 118/74  Pulse: 85  SpO2: 97%   Vitals:   06/03/24 1012  Weight: 175 lb 3.2 oz (79.5 kg)  Height: 5\' 4"  (1.626 m)   Body mass index is 30.07 kg/m.  General: Well Developed, well nourished, and in no acute distress.  Neuro: Alert and oriented x3, extra-ocular muscles intact, sensation grossly intact. Cranial nerves II through XII are grossly intact, motor, sensory, and coordinative functions are intact. HEENT: Normocephalic, atraumatic, neck supple, no masses, no lymphadenopathy, thyroid nonenlarged. Oropharynx, nasopharynx, external ear canals are unremarkable. Skin: Warm and dry, no rashes noted.  Cardiac: Regular rate and rhythm, no murmurs rubs or gallops. No peripheral  edema. Pulses symmetric. Respiratory: Clear to auscultation bilaterally. Speaking in full sentences.  Abdominal: Soft, nontender, nondistended, positive bowel sounds, no masses, no organomegaly. Musculoskeletal: Stable, and with full range of motion.  Impression and Recommendations:   The patient was counselled, risk factors were discussed, and anticipatory guidance given.  Problem List Items Addressed This Visit     Allergic rhinitis       She experiences seasonal allergies, managed with over-the-counter Zyrtec and increased water  intake. She avoids outdoor activities when pollen levels are high, and her symptoms are currently well-controlled. She uses Pataday and Lumify eye drops for eye symptoms and has annual eye exams. She had asthma as a child and underwent allergy injections. Since moving to her current location 21 years ago, her allergies have worsened, particularly affecting her eyes.  Seasonal Allergic Rhinitis Symptoms controlled with Zyrtec; Flonase as backup. - Continue Zyrtec as needed. - Consider adding Flonase nasal spray if symptoms worsen.     Calculus of gallbladder without cholecystitis without obstruction   Cholelithiasis Managed with dietary changes to prevent flare-ups. - Continue dietary modifications to avoid gallstone flare-ups.      Healthcare maintenance - Primary   Annual examination completed, risk stratification labs ordered, anticipatory guidance provided.  We will follow labs once resulted.  Shingles Vaccination Due for Shingrix vaccine; plans to receive post-trip. - Schedule nurse visit for first dose of Shingrix vaccine after returning from trip. - Plan for second dose of Shingrix vaccine 2-6 months after the first dose.      Relevant Orders   CBC   Hypertension   Hypertension Well-controlled hypertension on low-dose amlodipine  with no side effects. - Continue 5 mg amlodipine  daily. - Refill prescription as needed through pharmacy or  MyChart.      Relevant Orders   Comprehensive metabolic panel with GFR   Lipid panel   Iron deficiency anemia due to chronic blood loss   Iron Deficiency Anemia No symptoms; normal iron levels post-fibroid removal and IUD placement. - Order standard lab panel including blood count, can add iron panel if CBC abnormalities noted.      Plantar fasciitis, left   She had left plantar fasciitis, which is currently well-managed with exercises and monthly joint massages. She no longer requires pain medication for this issue.  Plantar Fasciitis Left plantar fasciitis asymptomatic with management. - Continue monthly joint massages and exercises.      Vitamin D  deficiency   Relevant Orders   VITAMIN D  25 Hydroxy (Vit-D Deficiency, Fractures)   Other Visit Diagnoses       Abnormal glucose       Relevant Orders   CBC   Hemoglobin A1c     Screening for cardiovascular condition       Relevant Orders   Comprehensive metabolic panel with GFR   Lipid panel     Breast cancer screening by mammogram       Relevant Orders  MM DIGITAL SCREENING BILATERAL        Orders & Medications Medications: No orders of the defined types were placed in this encounter.  Orders Placed This Encounter  Procedures   MM DIGITAL SCREENING BILATERAL   CBC   Comprehensive metabolic panel with GFR   Hemoglobin A1c   Lipid panel   VITAMIN D  25 Hydroxy (Vit-D Deficiency, Fractures)     Return in about 1 year (around 06/03/2025) for CPE.    Ma Saupe, MD, [facility]   Primary Care Sports Medicine Primary Care and Sports Medicine at [facility]

## 2024-06-03 NOTE — Assessment & Plan Note (Addendum)
 Annual examination completed, risk stratification labs ordered, anticipatory guidance provided.  We will follow labs once resulted.  Shingles Vaccination Due for Shingrix vaccine; plans to receive post-trip. - Schedule nurse visit for first dose of Shingrix vaccine after returning from trip. - Plan for second dose of Shingrix vaccine 2-6 months after the first dose.

## 2024-06-03 NOTE — Assessment & Plan Note (Signed)
 Cholelithiasis Managed with dietary changes to prevent flare-ups. - Continue dietary modifications to avoid gallstone flare-ups.

## 2024-06-03 NOTE — Assessment & Plan Note (Addendum)
 Hypertension Well-controlled hypertension on low-dose amlodipine  with no side effects. - Continue 5 mg amlodipine  daily. - Refill prescription as needed through pharmacy or MyChart.

## 2024-06-03 NOTE — Assessment & Plan Note (Signed)
 Iron Deficiency Anemia No symptoms; normal iron levels post-fibroid removal and IUD placement. - Order standard lab panel including blood count, can add iron panel if CBC abnormalities noted.

## 2024-06-03 NOTE — Assessment & Plan Note (Addendum)
 She had left plantar fasciitis, which is currently well-managed with exercises and monthly joint massages. She no longer requires pain medication for this issue.  Plantar Fasciitis Left plantar fasciitis asymptomatic with management. - Continue monthly joint massages and exercises.

## 2024-06-03 NOTE — Patient Instructions (Signed)
-   Obtain fasting labs with orders provided (can have water  or black coffee but otherwise no food or drink x 8 hours before labs) - Review information provided - Attend eye doctor annually, dentist every 6 months, work towards or maintain 30 minutes of moderate intensity physical activity at least 5 days per week, and consume a balanced diet - Return in 1 year for physical - Contact us  for any questions between now and then   Patient Action Plan  1. Seasonal Allergic Rhinitis:    - Continue taking Zyrtec as needed.    - Use Flonase nasal spray if symptoms worsen.  2. Cholelithiasis (Gallstones):    - Maintain dietary changes to prevent gallstone flare-ups.  3. Shingles Vaccination:    - Schedule a nurse visit for the first dose of the Shingrix vaccine after your trip.    - Plan to receive the second dose 2-6 months after the first dose.  4. Hypertension:    - Continue taking amlodipine  5 mg daily.    - Refill your prescription as needed through your pharmacy or MyChart.  5. Iron Deficiency Anemia:    - Complete the standard lab panel, including blood count. An iron panel will be added if there are any abnormalities.  6. Plantar Fasciitis (Left Foot):    - Continue monthly joint massages and exercises.  Red Flags: Contact your healthcare provider if you experience new or worsening symptoms, severe allergic reactions, or any concerns with your medications.

## 2024-06-04 ENCOUNTER — Ambulatory Visit: Payer: Self-pay | Admitting: Family Medicine

## 2024-06-04 LAB — CBC
Hematocrit: 44 % (ref 34.0–46.6)
Hemoglobin: 14.6 g/dL (ref 11.1–15.9)
MCH: 30 pg (ref 26.6–33.0)
MCHC: 33.2 g/dL (ref 31.5–35.7)
MCV: 90 fL (ref 79–97)
Platelets: 276 10*3/uL (ref 150–450)
RBC: 4.87 x10E6/uL (ref 3.77–5.28)
RDW: 13.2 % (ref 11.7–15.4)
WBC: 9.5 10*3/uL (ref 3.4–10.8)

## 2024-06-04 LAB — COMPREHENSIVE METABOLIC PANEL WITH GFR
ALT: 19 IU/L (ref 0–32)
AST: 18 IU/L (ref 0–40)
Albumin: 4.5 g/dL (ref 3.9–4.9)
Alkaline Phosphatase: 81 IU/L (ref 44–121)
BUN/Creatinine Ratio: 15 (ref 9–23)
BUN: 9 mg/dL (ref 6–24)
Bilirubin Total: 0.4 mg/dL (ref 0.0–1.2)
CO2: 23 mmol/L (ref 20–29)
Calcium: 9.8 mg/dL (ref 8.7–10.2)
Chloride: 101 mmol/L (ref 96–106)
Creatinine, Ser: 0.6 mg/dL (ref 0.57–1.00)
Globulin, Total: 2.8 g/dL (ref 1.5–4.5)
Glucose: 97 mg/dL (ref 70–99)
Potassium: 4.3 mmol/L (ref 3.5–5.2)
Sodium: 138 mmol/L (ref 134–144)
Total Protein: 7.3 g/dL (ref 6.0–8.5)
eGFR: 109 mL/min/{1.73_m2} (ref >59)

## 2024-06-04 LAB — LIPID PANEL
Chol/HDL Ratio: 3.4 ratio (ref 0.0–4.4)
Cholesterol, Total: 168 mg/dL (ref 100–199)
HDL: 50 mg/dL (ref >39)
LDL Chol Calc (NIH): 94 mg/dL (ref 0–99)
Triglycerides: 134 mg/dL (ref 0–149)
VLDL Cholesterol Cal: 24 mg/dL (ref 5–40)

## 2024-06-04 LAB — HEMOGLOBIN A1C
Est. average glucose Bld gHb Est-mCnc: 117 mg/dL
Hgb A1c MFr Bld: 5.7 % — ABNORMAL HIGH (ref 4.8–5.6)

## 2024-06-04 LAB — VITAMIN D 25 HYDROXY (VIT D DEFICIENCY, FRACTURES): Vit D, 25-Hydroxy: 14.6 ng/mL — ABNORMAL LOW (ref 30.0–100.0)

## 2024-06-19 ENCOUNTER — Ambulatory Visit (INDEPENDENT_AMBULATORY_CARE_PROVIDER_SITE_OTHER)

## 2024-06-19 DIAGNOSIS — Z23 Encounter for immunization: Secondary | ICD-10-CM

## 2024-08-21 ENCOUNTER — Ambulatory Visit

## 2024-08-29 ENCOUNTER — Telehealth: Payer: Self-pay

## 2024-08-29 DIAGNOSIS — K8020 Calculus of gallbladder without cholecystitis without obstruction: Secondary | ICD-10-CM

## 2024-08-29 NOTE — Telephone Encounter (Signed)
 Referral placed.  JM   Copied from CRM #[id number]. Topic: Referral - Request for Referral >> Aug 29, 2024  1:52 PM Rosaria BRAVO wrote: Did the patient discuss referral with their provider in the last year? Yes (If No - schedule appointment) (If Yes - send message)  Appointment offered? No. Already seen for this in June with PCP.   Type of order/referral and detailed reason for visit: GI, has gallstones  Preference of office, provider, location:   [facility] Dr. Ruel Kung MD. [address]  [place] - same location as office above.   If referral order, have you been seen by this specialty before? Yes (If Yes, this issue or another issue? When? Where?  Can we respond through MyChart? Yes

## 2024-09-05 ENCOUNTER — Telehealth: Payer: Self-pay | Admitting: Family Medicine

## 2024-09-05 NOTE — Telephone Encounter (Signed)
 Copied from CRM [id number]. Topic: Referral - Question >> Sep 05, 2024  9:03 AM Willma SAUNDERS wrote: Reason for CRM: Patient was provided a referral for Gastroenterology. Place where she requested and it was sent does not accept her insurance, [employer]. Is requesting a recommendation and new referral to someone who accepts her insurance.   Patient can be reached at [phone]

## 2024-09-05 NOTE — Telephone Encounter (Signed)
 Please review.  KP

## 2024-10-18 ENCOUNTER — Ambulatory Visit (INDEPENDENT_AMBULATORY_CARE_PROVIDER_SITE_OTHER)

## 2024-10-18 DIAGNOSIS — Z23 Encounter for immunization: Secondary | ICD-10-CM

## 2024-10-18 NOTE — Progress Notes (Signed)
 Patient is in office today for a nurse visit for Immunization. Patient Injection was given in the  Left deltoid. Patient tolerated injection well.  [name]  Primary Care & Sports Medicine [facility] CMA [facility]), [facility] [address]  [place] [address] Office [phone]  Fax: [phone]

## 2024-11-18 ENCOUNTER — Encounter: Payer: Self-pay | Admitting: Gastroenterology

## 2024-12-08 ENCOUNTER — Other Ambulatory Visit: Payer: Self-pay | Admitting: Family Medicine

## 2024-12-08 DIAGNOSIS — I10 Essential (primary) hypertension: Secondary | ICD-10-CM

## 2024-12-11 NOTE — Telephone Encounter (Signed)
 OV 06/03/24- advised 1 year follow up Requested Prescriptions  Pending Prescriptions Disp Refills   amLODipine  (NORVASC ) 5 MG tablet [Pharmacy Med Name: amLODIPine  Besylate 5 MG Oral Tablet] 90 tablet 0    Sig: Take 1 tablet by mouth once daily     Cardiovascular: Calcium Channel Blockers 2 Failed - 12/11/2024  9:47 AM      Failed - Valid encounter within last 6 months    Recent Outpatient Visits           6 months ago Healthcare maintenance   [facility] Ku, Selinda PARAS, MD       Future Appointments             In 5 months Ku, Selinda PARAS, MD [facility] Primary Care & [facility] Medicine at [facility], [phone] [place]            Passed - Last BP in normal range    BP Readings from Last 1 Encounters:  06/03/24 118/74         Passed - Last Heart Rate in normal range    Pulse Readings from Last 1 Encounters:  06/03/24 85

## 2024-12-26 ENCOUNTER — Encounter: Payer: Self-pay | Admitting: Internal Medicine

## 2025-01-01 NOTE — Progress Notes (Signed)
 "  Chief Complaint: Gallstones Primary GI MD: Unassigned  HPI: Discussed the use of AI scribe software for clinical note transcription with the patient, who gave verbal consent to proceed.  History of Present Illness   Jeanette Townsend is a 54 year old female with gallstones and fatty liver who presents for evaluation and management of gallstones.  In March 2025, she presented to the emergency room with severe cramping abdominal pain radiating to the back, lasting for hours. Imaging revealed multiple gallstones, and cholecystectomy was recommended, but she opted for dietary management.  Since the ER visit, she has not experienced recurrent severe abdominal pain. Significant dietary modifications include reducing portion sizes and avoiding beans, vegetables, fried foods, fatty foods, cheeses, and frozen meals. Occasional pizza is tolerated in moderation. These changes have prevented further episodes of abdominal pain.  Rare, mild postprandial discomfort described as a little itch or paper cut sensation occurs in the right upper abdomen after eating and resolves with walking. She denies heartburn, nausea, or vomiting. Occasional belching and mild bloating are noted.  Abdominal ultrasound showed fatty liver. She has intensified dietary modifications over the past ten months. Liver function tests have remained normal. Fib-4 score was calculated based on age, liver enzymes, and platelets.  Colonoscopy in 2022 was unremarkable, with a recommendation to repeat in seven years. Family history is notable for her mother having diverticulitis; there is no family history of colon cancer.   PREVIOUS GI WORKUP    Colonoscopy 05/06/2021 - One 1 mm polyp in the cecum, removed with a cold biopsy forceps. Resected and retrieved.  - One 7 mm polyp in the cecum, removed with a cold snare. Resected and retrieved. - tubular adenomas, repeat 7 years - repeat 04/2028  Past Medical History:  Diagnosis Date    Allergy    Anemia    Asthma    childhood   Asthma 08/14/2007   Qualifier: Diagnosis of   By: Avelina MD, Amy      IMO SNOMED Dx Update Oct 2024     Excessive vaginal bleeding    Fibroids    Gallstone     Past Surgical History:  Procedure Laterality Date   COLONOSCOPY WITH PROPOFOL  N/A 05/06/2021   Procedure: COLONOSCOPY WITH PROPOFOL ;  Surgeon: Jinny Carmine, MD;  Location: [facility];  Service: Endoscopy;  Laterality: N/A;   DILATATION & CURETTAGE/HYSTEROSCOPY WITH MYOSURE N/A 05/26/2016   Procedure: DILATATION & CURETTAGE/HYSTEROSCOPY WITH MYOSURE RESECTION OF SUBMUCOSAL FIBROID;  Surgeon: Glory High, MD;  Location: [facility];  Service: Gynecology;  Laterality: N/A;   DILATION AND CURETTAGE OF UTERUS  08/2011   HYSTEROSCOPY WITH D & C N/A 01/09/2020   Procedure: DILATATION AND CURETTAGE /HYSTEROSCOPY;  Surgeon: High Glory, MD;  Location: [facility];  Service: Gynecology;  Laterality: N/A;   INTRAUTERINE DEVICE (IUD) INSERTION N/A 01/09/2020   Procedure: INTRAUTERINE DEVICE (IUD) INSERTION;  Surgeon: High Glory, MD;  Location: [facility];  Service: Gynecology;  Laterality: N/A;   POLYPECTOMY  05/06/2021   Procedure: POLYPECTOMY;  Surgeon: Jinny Carmine, MD;  Location: [facility];  Service: Endoscopy;;    Current Outpatient Medications  Medication Sig Dispense Refill   amLODipine  (NORVASC ) 5 MG tablet Take 1 tablet by mouth once daily 90 tablet 0   Cholecalciferol (VITAMIN D3) 25 MCG (1000 UT) CAPS Take by mouth.     levonorgestrel (MIRENA) 20 MCG/DAY IUD 1 each by Intrauterine route once.     No current facility-administered medications for this visit.  Allergies as of 01/02/2025 - Review Complete 01/02/2025  Allergen Reaction Noted   No known allergies  01/02/2025    Family History  Problem Relation Age of Onset   Hypertension Mother    Diverticulitis Mother    Hypertension Father    Breast cancer Neg Hx     Social History   Socioeconomic  History   Marital status: Married    Spouse name: Not on file   Number of children: Not on file   Years of education: Not on file   Highest education level: Associate degree: occupational, scientist, product/process development, or vocational program  Occupational History   Not on file  Tobacco Use   Smoking status: Never   Smokeless tobacco: Never  Vaping Use   Vaping status: Never Used  Substance and Sexual Activity   Alcohol use: No   Drug use: No   Sexual activity: Yes    Birth control/protection: I.U.D.  Other Topics Concern   Not on file  Social History Narrative   Not on file   Social Drivers of Health   Tobacco Use: Low Risk (01/02/2025)   Patient History    Smoking Tobacco Use: Never    Smokeless Tobacco Use: Never    Passive Exposure: Not on file  Financial Resource Strain: Low Risk (06/01/2024)   Overall Financial Resource Strain (CARDIA)    Difficulty of Paying Living Expenses: Not very hard  Food Insecurity: No Food Insecurity (06/01/2024)   Hunger Vital Sign    Worried About Running Out of Food in the Last Year: Never true    Ran Out of Food in the Last Year: Never true  Transportation Needs: No Transportation Needs (06/01/2024)   PRAPARE - Administrator, Civil Service (Medical): No    Lack of Transportation (Non-Medical): No  Physical Activity: Insufficiently Active (06/01/2024)   Exercise Vital Sign    Days of Exercise per Week: 2 days    Minutes of Exercise per Session: 30 min  Stress: No Stress Concern Present (06/01/2024)   [employer] of Occupational Health - Occupational Stress Questionnaire    Feeling of Stress : Not at all  Social Connections: Socially Integrated (06/01/2024)   Social Connection and Isolation Panel    Frequency of Communication with Friends and Family: More than three times a week    Frequency of Social Gatherings with Friends and Family: More than three times a week    Attends Religious Services: More than 4 times per year    Active Member of [employer]  or Organizations: Yes    Attends Banker Meetings: 1 to 4 times per year    Marital Status: Married  Catering Manager Violence: Not At Risk (02/14/2023)   Humiliation, Afraid, Rape, and Kick questionnaire    Fear of Current or Ex-Partner: No    Emotionally Abused: No    Physically Abused: No    Sexually Abused: No  Depression (PHQ2-9): Low Risk (10/18/2024)   Depression (PHQ2-9)    PHQ-2 Score: 0  Alcohol Screen: Low Risk (06/01/2024)   Alcohol Screen    Last Alcohol Screening Score (AUDIT): 0  Housing: Low Risk (06/01/2024)   Housing Stability Vital Sign    Unable to Pay for Housing in the Last Year: No    Number of Times Moved in the Last Year: 0    Homeless in the Last Year: No  Utilities: Not At Risk (02/14/2023)   AHC Utilities    Threatened with loss of utilities: No  Health Literacy: Not on file    Review of Systems:    Constitutional: No weight loss, fever, chills, weakness or fatigue HEENT: Eyes: No change in vision               Ears, Nose, Throat:  No change in hearing or congestion Skin: No rash or itching Cardiovascular: No chest pain, chest pressure or palpitations   Respiratory: No SOB or cough Gastrointestinal: See HPI and otherwise negative Genitourinary: No dysuria or change in urinary frequency Neurological: No headache, dizziness or syncope Musculoskeletal: No new muscle or joint pain Hematologic: No bleeding or bruising Psychiatric: No history of depression or anxiety    Physical Exam:  Vital signs: BP 128/70   Pulse 79   Ht 5' 4 (1.626 m)   Wt 169 lb 2 oz (76.7 kg)   BMI 29.03 kg/m   Constitutional: NAD, alert and cooperative Head:  Normocephalic and atraumatic. Eyes:   PEERL, EOMI. No icterus. Conjunctiva pink. Respiratory: Respirations even and unlabored. Lungs clear to auscultation bilaterally.   No wheezes, crackles, or rhonchi.  Cardiovascular:  Regular rate and rhythm. No peripheral edema, cyanosis or pallor.   Gastrointestinal:  Soft, nondistended, nontender. No rebound or guarding. Normal bowel sounds. No appreciable masses or hepatomegaly. Rectal:  Declines Msk:  Symmetrical without gross deformities. Without edema, no deformity or joint abnormality.  Neurologic:  Alert and  oriented x4;  grossly normal neurologically.  Skin:   Dry and intact without significant lesions or rashes. Psychiatric: Oriented to person, place and time. Demonstrates good judgement and reason without abnormal affect or behaviors.  Physical Exam    RELEVANT LABS AND IMAGING: CBC    Component Value Date/Time   WBC 9.5 06/03/2024 1122   WBC 9.2 01/07/2020 0846   RBC 4.87 06/03/2024 1122   RBC 4.23 01/07/2020 0846   HGB 14.6 06/03/2024 1122   HCT 44.0 06/03/2024 1122   PLT 276 06/03/2024 1122   MCV 90 06/03/2024 1122   MCH 30.0 06/03/2024 1122   MCH 22.0 (L) 01/07/2020 0846   MCHC 33.2 06/03/2024 1122   MCHC 28.3 (L) 01/07/2020 0846   RDW 13.2 06/03/2024 1122   LYMPHSABS 1.7 03/08/2017 1119   MONOABS 0.5 07/20/2016 1055   EOSABS 0.1 03/08/2017 1119   BASOSABS 0.0 03/08/2017 1119    CMP     Component Value Date/Time   NA 138 06/03/2024 1122   K 4.3 06/03/2024 1122   CL 101 06/03/2024 1122   CO2 23 06/03/2024 1122   GLUCOSE 97 06/03/2024 1122   GLUCOSE 101 (H) 04/15/2016 1549   BUN 9 06/03/2024 1122   CREATININE 0.60 06/03/2024 1122   CALCIUM 9.8 06/03/2024 1122   PROT 7.3 06/03/2024 1122   ALBUMIN 4.5 06/03/2024 1122   AST 18 06/03/2024 1122   ALT 19 06/03/2024 1122   ALKPHOS 81 06/03/2024 1122   BILITOT 0.4 06/03/2024 1122   GFRNONAA 112 03/08/2017 1119   GFRAA 129 03/08/2017 1119     Assessment/Plan:   Cholelithiasis Epigastric pain RUQ US  with distended gallbladder and multiple stones.  Currently asymptomatic with dietary changes.  Very rare episodes of epigastric pain with eating.  No GERD, nausea, vomiting. - Continue to avoid fatty/greasy foods - If symptoms occur would refer to  [facility], patient currently is not interested in cholecystectomy at this time as she is doing well with diet management - H. pylori stool antigen - Follow-up as needed  Hepatic steatosis Mild hepatomegaly on RUQ ultrasound with possible steatosis.  Normal LFTS with Fib 4 score 0.76 excluding advanced fibrosis - Educate patient on fatty liver and recommended continued diet and exercise  History of colon polyps Colonoscopy 2022 with 2 tubular adenomas and recall of 7 years -- repeat 03/2028   Nestor Blower, PA-C [facility] 01/02/2025, 3:06 PM  Cc: Alvia Selinda PARAS, MD "

## 2025-01-02 ENCOUNTER — Encounter: Payer: Self-pay | Admitting: Internal Medicine

## 2025-01-02 ENCOUNTER — Ambulatory Visit: Admitting: Gastroenterology

## 2025-01-02 ENCOUNTER — Encounter: Payer: Self-pay | Admitting: Gastroenterology

## 2025-01-02 VITALS — BP 128/70 | HR 79 | Ht 64.0 in | Wt 169.1 lb

## 2025-01-02 DIAGNOSIS — Z860101 Personal history of adenomatous and serrated colon polyps: Secondary | ICD-10-CM | POA: Diagnosis not present

## 2025-01-02 DIAGNOSIS — K635 Polyp of colon: Secondary | ICD-10-CM

## 2025-01-02 DIAGNOSIS — R1013 Epigastric pain: Secondary | ICD-10-CM

## 2025-01-02 DIAGNOSIS — K8020 Calculus of gallbladder without cholecystitis without obstruction: Secondary | ICD-10-CM

## 2025-01-02 DIAGNOSIS — K760 Fatty (change of) liver, not elsewhere classified: Secondary | ICD-10-CM

## 2025-01-02 NOTE — Patient Instructions (Signed)
 Your provider has ordered [employer] stool testing for you. You have received a kit from our office today containing all necessary supplies to complete this test. Please carefully read the stool collection instructions provided in the kit before opening the accompanying materials. In addition, be sure there is a label providing your full name and date of birth on the puritan opti-swab tube that is supplied in the kit (if you do not see a label with this information on your test tube, please make us  aware before test collection!). After completing the test, you should secure the purtian tube into the specimen biohazard bag. The [facility] E-Req sheet (including date and time of specimen collection) should be placed into the outside pocket of the specimen biohazard bag and returned to the [place] lab (basement floor of [facility]) within 3 days of collection. Please make sure to give the specimen to a staff member at the lab. DO NOT leave the specimen on the counter.   If the specimen date and time (can be found in the upper right boxed portion of the sheet) are not filled out on the E-Req sheet, the test will NOT be performed.   _______________________________________________________  If your blood pressure at your visit was 140/90 or greater, please contact your primary care physician to follow up on this.  _______________________________________________________  If you are age 23 or older, your body mass index should be between 23-30. Your Body mass index is 29.03 kg/m. If this is out of the aforementioned range listed, please consider follow up with your Primary Care Provider.  If you are age 24 or younger, your body mass index should be between 19-25. Your Body mass index is 29.03 kg/m. If this is out of the aformentioned range listed, please consider follow up with your Primary Care Provider.   ________________________________________________________  The  [facility] providers would like to encourage you to use MYCHART to communicate with providers for non-urgent requests or questions.  Due to long hold times on the telephone, sending your provider a message by [facility] may be a faster and more efficient way to get a response.  Please allow 48 business hours for a response.  Please remember that this is for non-urgent requests.  _______________________________________________________  [facility] is using a team-based approach to care.  Your team is made up of your doctor and two to three APPS. Our APPS (Nurse Practitioners and Physician Assistants) work with your physician to ensure care continuity for you. They are fully qualified to address your health concerns and develop a treatment plan. They communicate directly with your gastroenterologist to care for you. Seeing the Advanced Practice Practitioners on your physician's team can help you by facilitating care more promptly, often allowing for earlier appointments, access to diagnostic testing, procedures, and other specialty referrals.

## 2025-01-10 ENCOUNTER — Telehealth: Payer: Self-pay | Admitting: Gastroenterology

## 2025-01-10 NOTE — Telephone Encounter (Signed)
 Stool studies returned negative for H. pylori

## 2025-01-22 ENCOUNTER — Encounter: Payer: Self-pay | Admitting: Gastroenterology

## 2025-06-04 ENCOUNTER — Encounter: Admitting: Family Medicine
